# Patient Record
Sex: Male | Born: 1960 | Race: White | Hispanic: No | Marital: Married | Smoking: Never smoker
Health system: Southern US, Community
[De-identification: ages and names within clinical notes are randomized; demographics above are authoritative.]

## PROBLEM LIST (undated history)

## (undated) DIAGNOSIS — I10 Essential (primary) hypertension: Secondary | ICD-10-CM

## (undated) DIAGNOSIS — T304 Corrosion of unspecified body region, unspecified degree: Secondary | ICD-10-CM

## (undated) HISTORY — PX: SKIN GRAFT: SHX250

---

## 2005-09-30 ENCOUNTER — Ambulatory Visit: Payer: Self-pay

## 2006-06-09 ENCOUNTER — Emergency Department: Payer: Self-pay | Admitting: Emergency Medicine

## 2006-06-15 ENCOUNTER — Ambulatory Visit: Payer: Self-pay | Admitting: Family Medicine

## 2006-10-09 ENCOUNTER — Ambulatory Visit: Payer: Self-pay | Admitting: Family Medicine

## 2006-11-25 ENCOUNTER — Ambulatory Visit: Payer: Self-pay | Admitting: Family Medicine

## 2006-12-21 ENCOUNTER — Ambulatory Visit: Payer: Self-pay | Admitting: Family Medicine

## 2006-12-25 ENCOUNTER — Ambulatory Visit: Payer: Self-pay

## 2006-12-26 ENCOUNTER — Ambulatory Visit: Payer: Self-pay | Admitting: Family Medicine

## 2007-08-02 ENCOUNTER — Encounter (INDEPENDENT_AMBULATORY_CARE_PROVIDER_SITE_OTHER): Payer: Self-pay | Admitting: *Deleted

## 2007-12-06 ENCOUNTER — Telehealth: Payer: Self-pay | Admitting: Family Medicine

## 2007-12-18 ENCOUNTER — Encounter: Payer: Self-pay | Admitting: Family Medicine

## 2007-12-18 DIAGNOSIS — I10 Essential (primary) hypertension: Secondary | ICD-10-CM | POA: Insufficient documentation

## 2007-12-18 DIAGNOSIS — I739 Peripheral vascular disease, unspecified: Secondary | ICD-10-CM

## 2007-12-18 DIAGNOSIS — G4730 Sleep apnea, unspecified: Secondary | ICD-10-CM | POA: Insufficient documentation

## 2007-12-18 DIAGNOSIS — E785 Hyperlipidemia, unspecified: Secondary | ICD-10-CM | POA: Insufficient documentation

## 2007-12-18 DIAGNOSIS — K219 Gastro-esophageal reflux disease without esophagitis: Secondary | ICD-10-CM | POA: Insufficient documentation

## 2007-12-18 DIAGNOSIS — E669 Obesity, unspecified: Secondary | ICD-10-CM
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
## 2008-02-03 ENCOUNTER — Telehealth: Payer: Self-pay | Admitting: Family Medicine

## 2009-07-12 ENCOUNTER — Ambulatory Visit: Payer: Self-pay | Admitting: Gastroenterology

## 2009-09-10 ENCOUNTER — Ambulatory Visit: Payer: Self-pay | Admitting: Gastroenterology

## 2009-09-14 ENCOUNTER — Ambulatory Visit: Payer: Self-pay | Admitting: Gastroenterology

## 2009-10-11 ENCOUNTER — Ambulatory Visit: Payer: Self-pay | Admitting: Gastroenterology

## 2011-03-06 ENCOUNTER — Ambulatory Visit: Payer: Self-pay | Admitting: Cardiovascular Disease

## 2011-05-10 ENCOUNTER — Ambulatory Visit: Payer: Self-pay | Admitting: Family Medicine

## 2011-08-25 ENCOUNTER — Emergency Department: Payer: Self-pay | Admitting: Unknown Physician Specialty

## 2021-01-10 ENCOUNTER — Observation Stay: Payer: Self-pay

## 2021-01-10 ENCOUNTER — Inpatient Hospital Stay
Admission: EM | Admit: 2021-01-10 | Discharge: 2021-01-12 | DRG: 065 | Disposition: A | Discharge status: Home Health | Payer: Self-pay | Attending: Family Medicine | Admitting: Family Medicine

## 2021-01-10 ENCOUNTER — Emergency Department: Payer: Self-pay

## 2021-01-10 ENCOUNTER — Other Ambulatory Visit: Payer: Self-pay

## 2021-01-10 DIAGNOSIS — R4701 Aphasia: Secondary | ICD-10-CM | POA: Diagnosis present

## 2021-01-10 DIAGNOSIS — Z9114 Patient's other noncompliance with medication regimen: Secondary | ICD-10-CM

## 2021-01-10 DIAGNOSIS — R2990 Unspecified symptoms and signs involving the nervous system: Secondary | ICD-10-CM | POA: Insufficient documentation

## 2021-01-10 DIAGNOSIS — I6340 Cerebral infarction due to embolism of unspecified cerebral artery: Principal | ICD-10-CM | POA: Diagnosis present

## 2021-01-10 DIAGNOSIS — I639 Cerebral infarction, unspecified: Secondary | ICD-10-CM | POA: Diagnosis present

## 2021-01-10 DIAGNOSIS — G4730 Sleep apnea, unspecified: Secondary | ICD-10-CM | POA: Diagnosis present

## 2021-01-10 DIAGNOSIS — E876 Hypokalemia: Secondary | ICD-10-CM | POA: Diagnosis present

## 2021-01-10 DIAGNOSIS — Z8249 Family history of ischemic heart disease and other diseases of the circulatory system: Secondary | ICD-10-CM

## 2021-01-10 DIAGNOSIS — I6930 Unspecified sequelae of cerebral infarction: Secondary | ICD-10-CM

## 2021-01-10 DIAGNOSIS — R29701 NIHSS score 1: Secondary | ICD-10-CM | POA: Diagnosis present

## 2021-01-10 DIAGNOSIS — I672 Cerebral atherosclerosis: Secondary | ICD-10-CM | POA: Diagnosis present

## 2021-01-10 DIAGNOSIS — E785 Hyperlipidemia, unspecified: Secondary | ICD-10-CM | POA: Diagnosis present

## 2021-01-10 DIAGNOSIS — Z8673 Personal history of transient ischemic attack (TIA), and cerebral infarction without residual deficits: Secondary | ICD-10-CM

## 2021-01-10 DIAGNOSIS — I4891 Unspecified atrial fibrillation: Secondary | ICD-10-CM | POA: Diagnosis present

## 2021-01-10 DIAGNOSIS — Z825 Family history of asthma and other chronic lower respiratory diseases: Secondary | ICD-10-CM

## 2021-01-10 DIAGNOSIS — I6389 Other cerebral infarction: Secondary | ICD-10-CM

## 2021-01-10 DIAGNOSIS — Z20822 Contact with and (suspected) exposure to covid-19: Secondary | ICD-10-CM | POA: Diagnosis present

## 2021-01-10 DIAGNOSIS — I10 Essential (primary) hypertension: Secondary | ICD-10-CM | POA: Diagnosis present

## 2021-01-10 DIAGNOSIS — G4733 Obstructive sleep apnea (adult) (pediatric): Secondary | ICD-10-CM | POA: Diagnosis present

## 2021-01-10 DIAGNOSIS — I739 Peripheral vascular disease, unspecified: Secondary | ICD-10-CM | POA: Diagnosis present

## 2021-01-10 DIAGNOSIS — K219 Gastro-esophageal reflux disease without esophagitis: Secondary | ICD-10-CM | POA: Diagnosis present

## 2021-01-10 DIAGNOSIS — G8191 Hemiplegia, unspecified affecting right dominant side: Secondary | ICD-10-CM | POA: Diagnosis present

## 2021-01-10 HISTORY — DX: Corrosion of unspecified body region, unspecified degree: T30.4

## 2021-01-10 HISTORY — DX: Essential (primary) hypertension: I10

## 2021-01-10 LAB — COMPREHENSIVE METABOLIC PANEL
ALT: 18 U/L (ref 0–44)
AST: 19 U/L (ref 15–41)
Albumin: 4.3 g/dL (ref 3.5–5.0)
Alkaline Phosphatase: 57 U/L (ref 38–126)
Anion gap: 9 (ref 5–15)
BUN: 11 mg/dL (ref 6–20)
CO2: 26 mmol/L (ref 22–32)
Calcium: 9.1 mg/dL (ref 8.9–10.3)
Chloride: 106 mmol/L (ref 98–111)
Creatinine, Ser: 0.98 mg/dL (ref 0.61–1.24)
GFR, Estimated: >60 mL/min (ref >60)
Glucose, Bld: 101 mg/dL — ABNORMAL HIGH (ref 70–99)
Potassium: 3.5 mmol/L (ref 3.5–5.1)
Sodium: 141 mmol/L (ref 135–145)
Total Bilirubin: 0.6 mg/dL (ref 0.3–1.2)
Total Protein: 7.3 g/dL (ref 6.5–8.1)

## 2021-01-10 LAB — URINALYSIS, COMPLETE (UACMP) WITH MICROSCOPIC
Bacteria, UA: NONE SEEN
Bilirubin Urine: NEGATIVE
Glucose, UA: NEGATIVE mg/dL
Hgb urine dipstick: NEGATIVE
Ketones, ur: 5 mg/dL — AB
Leukocytes,Ua: NEGATIVE
Nitrite: NEGATIVE
Protein, ur: NEGATIVE mg/dL
Specific Gravity, Urine: 1.02 (ref 1.005–1.030)
Squamous Epithelial / HPF: NONE SEEN (ref 0–5)
pH: 6 (ref 5.0–8.0)

## 2021-01-10 LAB — URINE DRUG SCREEN, QUALITATIVE (ARMC ONLY)
Amphetamines, Ur Screen: NOT DETECTED
Barbiturates, Ur Screen: NOT DETECTED
Benzodiazepine, Ur Scrn: NOT DETECTED
Cannabinoid 50 Ng, Ur ~~LOC~~: NOT DETECTED
Cocaine Metabolite,Ur ~~LOC~~: NOT DETECTED
MDMA (Ecstasy)Ur Screen: NOT DETECTED
Methadone Scn, Ur: NOT DETECTED
Opiate, Ur Screen: NOT DETECTED
Phencyclidine (PCP) Ur S: NOT DETECTED
Tricyclic, Ur Screen: NOT DETECTED

## 2021-01-10 LAB — CBC
HCT: 48.1 % (ref 39.0–52.0)
Hemoglobin: 15.9 g/dL (ref 13.0–17.0)
MCH: 29.3 pg (ref 26.0–34.0)
MCHC: 33.1 g/dL (ref 30.0–36.0)
MCV: 88.6 fL (ref 80.0–100.0)
Platelets: 297 10*3/uL (ref 150–400)
RBC: 5.43 MIL/uL (ref 4.22–5.81)
RDW: 13.4 % (ref 11.5–15.5)
WBC: 4.2 10*3/uL (ref 4.0–10.5)
nRBC: 0 % (ref 0.0–0.2)

## 2021-01-10 LAB — TROPONIN I (HIGH SENSITIVITY): Troponin I (High Sensitivity): 6 ng/L (ref <18)

## 2021-01-10 LAB — ETHANOL: Alcohol, Ethyl (B): <10 mg/dL (ref <10)

## 2021-01-10 LAB — RESP PANEL BY RT-PCR (FLU A&B, COVID) ARPGX2
Influenza A by PCR: NEGATIVE
Influenza B by PCR: NEGATIVE
SARS Coronavirus 2 by RT PCR: NEGATIVE

## 2021-01-10 MED ORDER — ACETAMINOPHEN 160 MG/5ML PO SOLN
650.0000 mg | ORAL | Status: DC | PRN
  Filled 2021-01-10: qty 20.3

## 2021-01-10 MED ORDER — LISINOPRIL 10 MG PO TABS
10.0000 mg | ORAL_TABLET | Freq: Every day | ORAL | Status: DC
  Administered 2021-01-11 – 2021-01-12 (×2): 10 mg via ORAL
  Filled 2021-01-10 (×2): qty 1

## 2021-01-10 MED ORDER — HYDRALAZINE HCL 20 MG/ML IJ SOLN
10.0000 mg | Freq: Four times a day (QID) | INTRAMUSCULAR | Status: DC | PRN
Start: 2021-01-10 — End: 2021-01-12

## 2021-01-10 MED ORDER — ONDANSETRON HCL 4 MG/2ML IJ SOLN: 4.0000 mg | Freq: Four times a day (QID) | INTRAMUSCULAR | Status: DC | PRN

## 2021-01-10 MED ORDER — ATORVASTATIN CALCIUM 20 MG PO TABS: 40.0000 mg | ORAL_TABLET | Freq: Every day | ORAL | Status: DC

## 2021-01-10 MED ORDER — ASPIRIN EC 81 MG PO TBEC
81.0000 mg | DELAYED_RELEASE_TABLET | Freq: Every day | ORAL | Status: DC
  Administered 2021-01-11 – 2021-01-12 (×2): 81 mg via ORAL
  Filled 2021-01-10 (×2): qty 1

## 2021-01-10 MED ORDER — IOHEXOL 350 MG/ML SOLN
75.0000 mL | Freq: Once | INTRAVENOUS | Status: AC | PRN
  Administered 2021-01-10: 75 mL via INTRAVENOUS

## 2021-01-10 MED ORDER — ACETAMINOPHEN 650 MG RE SUPP: 650.0000 mg | RECTAL | Status: DC | PRN

## 2021-01-10 MED ORDER — CLOPIDOGREL BISULFATE 75 MG PO TABS
75.0000 mg | ORAL_TABLET | Freq: Every day | ORAL | Status: DC
  Administered 2021-01-10 – 2021-01-12 (×3): 75 mg via ORAL
  Filled 2021-01-10 (×3): qty 1

## 2021-01-10 MED ORDER — ASPIRIN 81 MG PO CHEW
324.0000 mg | CHEWABLE_TABLET | Freq: Once | ORAL | Status: AC
  Administered 2021-01-10: 324 mg via ORAL
  Filled 2021-01-10: qty 4

## 2021-01-10 MED ORDER — STROKE: EARLY STAGES OF RECOVERY BOOK: Freq: Once | Status: DC

## 2021-01-10 MED ORDER — ENOXAPARIN SODIUM 40 MG/0.4ML ~~LOC~~ SOLN
40.0000 mg | SUBCUTANEOUS | Status: DC
  Administered 2021-01-10 – 2021-01-11 (×2): 40 mg via SUBCUTANEOUS
  Filled 2021-01-10 (×2): qty 0.4

## 2021-01-10 MED ORDER — ACETAMINOPHEN 325 MG PO TABS: 650.0000 mg | ORAL_TABLET | ORAL | Status: DC | PRN

## 2021-01-10 NOTE — ED Notes (Signed)
Pt ambulatory to restroom without assistance at this time.  

## 2021-01-10 NOTE — ED Triage Notes (Addendum)
Pt c/o feeling confused for the past week or 2, denies any pain. Clear speech, denies any numbness, steady gait to triage, no facial droop noted. Pt has some pauses in speech , having difficulty finding the right words
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
## 2021-01-10 NOTE — ED Notes (Signed)
Pt transported to CT at this time.

## 2021-01-10 NOTE — Evaluation (Signed)
Physical Therapy Evaluation Patient Details Name: Dalton Wang MRN: [id number] DOB: [birth year] Today's Date: 01/10/2021   History of Present Illness  Pt is a 60 y.o. male with a past medical history of hypertension, hyperlipidemia, gastric reflux, presents to the emergency department for confusion.  MD assessment includes AMS and acute CVA.  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    Clinical Impression  Pt was pleasant and motivated to participate during the session.  Pt presented with some word finding/formulating difficulty as well as difficulty with details during history taking that he stated was not baseline for him but was able to follow all commands well during the session.  Pt presented with no noted deficits in strength, sensation, or vision but did present with deficits in BUE fine motor coordination and was mildly unsteady in standing balance training and during gait training.  Pt declined use of the RW during the session but education provided on safety benefits of the walker at this time.  Pt will benefit from HHPT services upon discharge to safely address deficits listed in patient problem list for decreased caregiver assistance and eventual return to PLOF.      Follow Up Recommendations Home health PT;Supervision for mobility/OOB    Equipment Recommendations  None recommended by PT    Recommendations for Other Services       Precautions / Restrictions Precautions Precautions: Fall Restrictions Weight Bearing Restrictions: No      Mobility  Bed Mobility Overal bed mobility: Independent                  Transfers Overall transfer level: Needs assistance Equipment used: None Transfers: Sit to/from Stand Sit to Stand: Supervision         General transfer comment: Min sway upon coming to initial stand  but able to self-correct; good eccentric and concentric control during sit to/from stand  Ambulation/Gait Ambulation/Gait assistance: Min guard Gait Distance (Feet): 200 Feet Assistive device: None Gait Pattern/deviations: Step-through pattern;Decreased step length - right;Decreased step length - left;Drifts right/left;Narrow base of support;Scissoring Gait velocity: decreased   General Gait Details: Slow cadence with mild drifting left/right and occasional mild scissoring but did not require physical assistance to prevent LOB; increased sway and scissoring during dynamic gait training with head turns  Stairs            Wheelchair Mobility    Modified Rankin (Stroke Patients Only)       Balance Overall balance assessment: Needs assistance   Sitting balance-Leahy Scale: Normal     Standing balance support: No upper extremity supported;During functional activity Standing balance-Leahy Scale: Fair Standing balance comment: Per above drifting left/right during ambulation; also pt unable to maintain balance during LLE SLS attempt and required assist to prevent fall; steady with static standing with feet together and eyes closed Single Leg Stance - Right Leg: 5 Single Leg Stance - Left Leg: 2                         Pertinent Vitals/Pain Pain Assessment: No/denies pain    Home Living Family/patient expects to be discharged to:: Private residence Living Arrangements: Alone Available Help at Discharge: Family;Available 24 hours/day Type of Home: House Home Access: Stairs to enter Entrance Stairs-Rails: Right Entrance Stairs-Number of Steps: 2 Home Layout: One level Home Equipment: Other (comment) Additional Comments:  Pt stated has multiple walkers that belonged to his parents but unsure of the type    Prior Function Level of Independence: Independent         Comments: Ind amb community distances, no fall history, Ind with ADLs     Hand Dominance         Extremity/Trunk Assessment   Upper Extremity Assessment Upper Extremity Assessment: LUE deficits/detail;RUE deficits/detail RUE Deficits / Details: RUE elbow and grip strength 5/5, decreased fine motor coordination bilaterally but R>L RUE Sensation: WNL RUE Coordination: decreased fine motor LUE Deficits / Details: LUE elbow and grip strength 5/5; decreased fine motor coordination bilaterally but R>L LUE Sensation: WNL LUE Coordination: decreased fine motor    Lower Extremity Assessment Lower Extremity Assessment: Overall WFL for tasks assessed       Communication   Communication: Expressive difficulties (Pt with some difficulty with word finding/formulation)  Cognition Arousal/Alertness: Awake/alert Behavior During Therapy: Flat affect Overall Cognitive Status: No family/caregiver present to determine baseline cognitive functioning                                 General Comments: Pt with minor difficulty at times providing details during history taking but was able to answer questions with increased time and followed all commands well      General Comments      Exercises Other Exercises Other Exercises: Dynamic gait training with start/stops, head turns, and 180 deg pivot turns Other Exercises: Static standing balance training with combinations of foot positions, head turns, and eyes open/closed   Assessment/Plan    PT Assessment Patient needs continued PT services  PT Problem List Decreased balance;Decreased coordination;Decreased knowledge of use of DME       PT Treatment Interventions DME instruction;Gait training;Stair training;Functional mobility training;Therapeutic activities;Therapeutic exercise;Balance training;Patient/family education    PT Goals (Current goals can be found in the Care Plan section)  Acute Rehab PT Goals Patient Stated Goal: To walk better and return home PT Goal Formulation: With patient Time For Goal Achievement:  01/23/21 Potential to Achieve Goals: Good    Frequency 7X/week   Barriers to discharge        Co-evaluation               AM-PAC PT "6 Clicks" Mobility  Outcome Measure Help needed turning from your back to your side while in a flat bed without using bedrails?: None Help needed moving from lying on your back to sitting on the side of a flat bed without using bedrails?: None Help needed moving to and from a bed to a chair (including a wheelchair)?: A Little Help needed standing up from a chair using your arms (e.g., wheelchair or bedside chair)?: A Little Help needed to walk in hospital room?: A Little Help needed climbing 3-5 steps with a railing? : A Little 6 Click Score: 20    End of Session Equipment Utilized During Treatment: Gait belt Activity Tolerance: Patient tolerated treatment well Patient left: in bed;with nursing/sitter in room Nurse Communication: Mobility status PT Visit Diagnosis: Unsteadiness on feet (R26.81);Difficulty in walking, not elsewhere classified (R26.2);Other symptoms and signs involving the nervous system (R29.898)    Time: 3007-6226 PT Time Calculation (min) (ACUTE ONLY): 23 min   Charges:   PT Evaluation $PT Eval Moderate Complexity: 1 Mod PT Treatments $Therapeutic Exercise: 8-22 mins        D. Elly Modena PT, DPT 01/10/21, 5:39 PM
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
## 2021-01-10 NOTE — ED Notes (Signed)
Pt via POV from home. Pt c/o increased confusion the past couple of [place]. Pt has recently taken off of his home medications BP. Denies any pain, fevers, NVD. Pt is alert and oriented to self, situation, and time but disoriented to place. Denies any urinary symptoms.
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
## 2021-01-10 NOTE — Progress Notes (Signed)
   01/10/21 1525  Clinical Encounter Type  Visited With Patient  Visit Type Initial  Referral From Nurse  Consult/Referral To Chaplain  Spiritual Encounters  Spiritual Needs Prayer;Emotional  Chaplain Mattox Schorr responded to a Code Stemi in ED-2, Pt Dalton Wang. Pt stable but disoriented to surroundings. I provided ministry of presence and prayer. No family members present at the time.

## 2021-01-10 NOTE — ED Notes (Signed)
Pt given meal tray.

## 2021-01-11 ENCOUNTER — Observation Stay (HOSPITAL_COMMUNITY)
Admit: 2021-01-11 | Discharge: 2021-01-11 | Disposition: A | Discharge status: Home / Self Care | Payer: Self-pay | Attending: Internal Medicine | Admitting: Internal Medicine

## 2021-01-11 DIAGNOSIS — I6389 Other cerebral infarction: Secondary | ICD-10-CM

## 2021-01-11 DIAGNOSIS — I639 Cerebral infarction, unspecified: Secondary | ICD-10-CM

## 2021-01-11 LAB — BASIC METABOLIC PANEL
Anion gap: 7 (ref 5–15)
BUN: 11 mg/dL (ref 6–20)
CO2: 25 mmol/L (ref 22–32)
Calcium: 8.8 mg/dL — ABNORMAL LOW (ref 8.9–10.3)
Chloride: 107 mmol/L (ref 98–111)
Creatinine, Ser: 0.78 mg/dL (ref 0.61–1.24)
GFR, Estimated: >60 mL/min (ref >60)
Glucose, Bld: 90 mg/dL (ref 70–99)
Potassium: 3.4 mmol/L — ABNORMAL LOW (ref 3.5–5.1)
Sodium: 139 mmol/L (ref 135–145)

## 2021-01-11 LAB — LIPID PANEL
Cholesterol: 204 mg/dL — ABNORMAL HIGH (ref 0–200)
HDL: 41 mg/dL (ref >40)
LDL Cholesterol: 147 mg/dL — ABNORMAL HIGH (ref 0–99)
Total CHOL/HDL Ratio: 5 RATIO
Triglycerides: 79 mg/dL (ref <150)
VLDL: 16 mg/dL (ref 0–40)

## 2021-01-11 LAB — CBC
HCT: 43.8 % (ref 39.0–52.0)
Hemoglobin: 15.1 g/dL (ref 13.0–17.0)
MCH: 30.3 pg (ref 26.0–34.0)
MCHC: 34.5 g/dL (ref 30.0–36.0)
MCV: 87.8 fL (ref 80.0–100.0)
Platelets: 276 10*3/uL (ref 150–400)
RBC: 4.99 MIL/uL (ref 4.22–5.81)
RDW: 13.4 % (ref 11.5–15.5)
WBC: 4.8 10*3/uL (ref 4.0–10.5)
nRBC: 0 % (ref 0.0–0.2)

## 2021-01-11 LAB — ECHOCARDIOGRAM COMPLETE BUBBLE STUDY
AR max vel: 1.95 cm2
AV Area VTI: 2.43 cm2
AV Area mean vel: 1.89 cm2
AV Mean grad: 10 mmHg
AV Peak grad: 17.8 mmHg
Ao pk vel: 2.11 m/s
Area-P 1/2: 2.92 cm2
S' Lateral: 2.69 cm

## 2021-01-11 LAB — HEMOGLOBIN A1C
Hgb A1c MFr Bld: 5.2 % (ref 4.8–5.6)
Mean Plasma Glucose: 102.54 mg/dL

## 2021-01-11 LAB — TSH: TSH: 2.357 u[IU]/mL (ref 0.350–4.500)

## 2021-01-11 LAB — HIV ANTIBODY (ROUTINE TESTING W REFLEX): HIV Screen 4th Generation wRfx: NONREACTIVE

## 2021-01-11 MED ORDER — ATORVASTATIN CALCIUM 20 MG PO TABS
80.0000 mg | ORAL_TABLET | Freq: Every day | ORAL | Status: DC
  Administered 2021-01-11 – 2021-01-12 (×2): 80 mg via ORAL
  Filled 2021-01-11 (×2): qty 4

## 2021-01-11 MED ORDER — POTASSIUM CHLORIDE CRYS ER 20 MEQ PO TBCR
40.0000 meq | EXTENDED_RELEASE_TABLET | Freq: Once | ORAL | Status: AC
  Administered 2021-01-11: 11:00:00 40 meq via ORAL
  Filled 2021-01-11: qty 2

## 2021-01-11 NOTE — Progress Notes (Signed)
Progress Note    Dalton Wang  WUJ:[id number] DOB: 12/04/1960  DOA: 01/10/2021 PCP: Patient, No Pcp Per    Brief Narrative:    Medical records reviewed and are as summarized below:  Dalton Wang is an 60 y.o. male with medical history significant for hypertension, GERD, seasonal allergies, history of bilateral upper extremity surgeries secondary to traumatic fractures in the 1980s in 1993, history of forearm skin grafts, presented to the emergency department for chief concerns of confusion and weakness.  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 Mild irregularity on the left that could possibly be a source of embolic disease. - Complete echo with bubble study - MRI of the brain revealed acute infarction in the left basal ganglia and radiating white matter tracts measuring 2.7 cm in size.  Punctate acute infarction in the right external capsule and inferior basal ganglia on the right - LDL: 147- statin added --HgbA1c: 5.2 -tele monitoring -ASA/plavix -PT/OT  Hypokalemia -replete  Hypertension -previously on hydrochlorothiazide and ACE inhibitor -Stopped seeing his PCP around the time Covid started -Hydralazine 10 mg IV as needed every 6 hours for SBP greater than 160 -per neuro note goal is <160   Family Communication/Anticipated D/C date and plan/Code Status   DVT prophylaxis: Lovenox ordered. Code Status: Full Code.  Disposition Plan: Status is: Observation  The patient will require care spanning > 2 midnights and should be moved to inpatient because: Inpatient level of care appropriate due to severity of illness  Dispo: The patient is from: Home              Anticipated d/c is to: Home              Patient currently is not medically  stable to d/c.   Difficult to place patient yes -- no insurance listed if needs SNF (lives alone)         Medical Consultants:    neurology  Subjective:   Understands he had several strokes  Objective:    Vitals:   01/10/21 2230 01/10/21 2323 01/11/21 0103 01/11/21 0412  BP: (!) 157/96 (!) 148/100 (!) 173/100 123/83  Pulse: (!) 55 (!) 53 (!) 54 (!) 52  Resp: 16 15 15    Temp:   (!) 97.5 F (36.4 C) 97.6 F (36.4 C)  TempSrc:   Oral Oral  SpO2: 98% 98% 98% 98%  Weight:   87.3 kg   Height:   6\' 2"  (1.88 m)     Intake/Output Summary (Last 24 hours) at 01/11/2021 1125 Last data filed at 01/11/2021 1023 Gross per 24 hour  Intake 360 ml  Output -  Net 360 ml   Filed Weights   01/10/21 1015 01/11/21 0103  Weight: 90.7 kg 87.3 kg    Exam:  General: Appearance:    Well developed, well nourished male in no acute distress     Lungs:     respirations unlabored  Heart:    Bradycardic. Normal rhythm. No murmurs, rubs, or gallops.   MS:   All extremities are intact.   Neurologic:   Awake, alert, oriented x 3    Data Reviewed:   I have personally reviewed following labs and imaging studies:  Labs: Labs show the following:  Basic Metabolic Panel: Recent Labs  Lab 01/10/21 1021 01/11/21 0452  NA 141 139  K 3.5 3.4*  CL 106 107  CO2 26 25  GLUCOSE 101* 90  BUN 11 11  CREATININE 0.98 0.78  CALCIUM 9.1 8.8*   GFR Estimated Creatinine Clearance: 114.2 mL/min (by C-G formula based on SCr of 0.78 mg/dL). Liver Function Tests: Recent Labs  Lab 01/10/21 1021  AST 19  ALT 18  ALKPHOS 57  BILITOT 0.6  PROT 7.3  ALBUMIN 4.3   No results for input(s): LIPASE, AMYLASE in the last 168 hours. No results for input(s): AMMONIA in the last 168 hours. Coagulation profile No results for input(s): INR, PROTIME in the last 168 hours.  CBC: Recent Labs  Lab 01/10/21 1021 01/11/21 0452  WBC 4.2 4.8  HGB 15.9 15.1  HCT 48.1 43.8  MCV 88.6 87.8  PLT 297 276    Cardiac Enzymes: No results for input(s): CKTOTAL, CKMB, CKMBINDEX, TROPONINI in the last 168 hours. BNP (last 3 results) No results for input(s): PROBNP in the last 8760 hours. CBG: No results for input(s): GLUCAP in the last 168 hours. D-Dimer: No results for input(s): DDIMER in the last 72 hours. Hgb A1c: Recent Labs    01/11/21 0452  HGBA1C 5.2   Lipid Profile: Recent Labs    01/11/21 0452  CHOL 204*  HDL 41  LDLCALC 147*  TRIG 79  CHOLHDL 5.0   Thyroid function studies: Recent Labs    01/11/21 0452  TSH 2.357   Anemia work up: No results for input(s): VITAMINB12, FOLATE, FERRITIN, TIBC, IRON, RETICCTPCT in the last 72 hours. Sepsis Labs: Recent Labs  Lab 01/10/21 1021 01/11/21 0452  WBC 4.2 4.8    Microbiology Recent Results (from the past 240 hour(s))  Resp Panel by RT-PCR (Flu A&B, Covid) Nasopharyngeal Swab     Status: None   Collection Time: 01/10/21 11:18 AM   Specimen: Nasopharyngeal Swab; Nasopharyngeal(NP) swabs in vial transport medium  Result Value Ref Range Status   SARS Coronavirus 2 by RT PCR NEGATIVE NEGATIVE Final    Comment: (NOTE) SARS-CoV-2 target nucleic acids are NOT DETECTED.  The SARS-CoV-2 RNA is generally detectable in upper respiratory specimens during the acute phase of infection. The lowest concentration of SARS-CoV-2 viral copies this assay can detect is 138 copies/mL. A negative result does not preclude SARS-Cov-2 infection and should not be used as the sole basis for treatment or other patient management decisions. A negative result may occur with  improper specimen collection/handling, submission of specimen other than nasopharyngeal swab, presence of viral mutation(s) within the areas targeted by this assay, and inadequate number of viral copies(<138 copies/mL). A negative result must be combined with clinical observations, patient history, and epidemiological information. The expected result is Negative.  Fact  Sheet for Patients:  BloggerCourse.com  Fact Sheet for Healthcare Providers:  SeriousBroker.it  This test is no t yet approved or cleared by the [place] FDA and  has been authorized for detection and/or diagnosis of SARS-CoV-2 by FDA under an Emergency Use Authorization (EUA). This EUA will remain  in effect (meaning this test can be used) for the duration of the COVID-19 declaration under Section 564(b)(1) of the Act, 21 U.S.C.section 360bbb-3(b)(1), unless the authorization is terminated  or revoked sooner.       Influenza A by PCR NEGATIVE NEGATIVE Final   Influenza B by PCR NEGATIVE NEGATIVE Final    Comment: (NOTE) The Xpert Xpress SARS-CoV-2/FLU/RSV plus assay is intended  as an aid in the diagnosis of influenza from Nasopharyngeal swab specimens and should not be used as a sole basis for treatment. Nasal washings and aspirates are unacceptable for Xpert Xpress SARS-CoV-2/FLU/RSV testing.  Fact Sheet for Patients: BloggerCourse.com  Fact Sheet for Healthcare Providers: SeriousBroker.it  This test is not yet approved or cleared by the [place] FDA and has been authorized for detection and/or diagnosis of SARS-CoV-2 by FDA under an Emergency Use Authorization (EUA). This EUA will remain in effect (meaning this test can be used) for the duration of the COVID-19 declaration under Section 564(b)(1) of the Act, 21 U.S.C. section 360bbb-3(b)(1), unless the authorization is terminated or revoked.  Performed at [facility], [address] [place], [place] [address]     Procedures and diagnostic studies:  CT Head Wo Contrast  Result Date: 01/10/2021 CLINICAL DATA:  Mental status change EXAM: CT HEAD WITHOUT CONTRAST TECHNIQUE: Contiguous axial images were obtained from the base of the skull through the vertex without intravenous contrast. COMPARISON:   08/25/2011 FINDINGS: Brain: No evidence of acute infarction, hemorrhage, hydrocephalus, extra-axial collection or mass lesion/mass effect. Extensive chronic small vessel ischemic disease is identified within bilateral cerebral hemisphere. Multiple lacunar infarcts are identified within the subcortical white matter of the right frontal lobe, right parietal lobe, and left posterior frontal lobe. Chronic appearing right basal ganglia and right thalamic lacunar infarcts are noted. In the left basal ganglia there is a asymmetric area of low attenuation measuring 2 cm compatible with subacute to chronic infarct, image 17/2. Vascular: No hyperdense vessel or unexpected calcification. Skull: None Sinuses/Orbits: No acute finding. Other: None. IMPRESSION: 1. No acute intracranial abnormalities. 2. Chronic small vessel ischemic disease and brain atrophy. 3. 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 Electronically Signed   By: Signa Kell M.D.   On: 01/10/2021 11:59   MR BRAIN WO CONTRAST  Result Date: 01/10/2021 CLINICAL DATA:  Mental status changes.  Confusion. EXAM: MRI HEAD WITHOUT CONTRAST TECHNIQUE: Multiplanar, multiecho pulse sequences of the brain and surrounding structures were obtained without intravenous contrast. COMPARISON:  Head CT same day FINDINGS: Brain: Diffusion imaging shows a punctate acute infarction in the external capsule and within the inferior basal ganglia on the right. On the left, there is a larger region of acute infarction, measuring up to 2.7 cm in size, affecting the basal ganglia and radiating white matter tracts. Elsewhere, there are old small vessel infarctions within both sides of the pons. Old insults within both middle cerebellar peduncles. Multiple old small vessel thalamic infarctions. Old small vessel basal ganglia infarctions. Moderate chronic small-vessel ischemic changes of the hemispheric white. No cortical or large vessel territory infarction. No mass lesion,  hemorrhage, hydrocephalus or extra-axial collection. Vascular: Major vessels at the base of the brain show flow. Skull and upper cervical spine: Negative Sinuses/Orbits: Clear/normal Other: None IMPRESSION: Background pattern of extensive chronic small vessel ischemic changes throughout the brain as outlined above. 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 Punctate acute infarction in the right external capsule and inferior basal ganglia on the right. Electronically Signed   By: Paulina Fusi M.D.   On: 01/10/2021 14:31   CT ANGIO HEAD CODE STROKE  Result Date: 01/10/2021 CLINICAL DATA:  Follow-up stroke shown earlier today by MRI. EXAM: CT ANGIOGRAPHY HEAD AND NECK TECHNIQUE: Multidetector CT imaging of the head and neck was performed using the standard protocol during bolus administration of intravenous contrast.  Multiplanar CT image reconstructions and MIPs were obtained to evaluate the vascular anatomy. Carotid stenosis measurements (when applicable) are obtained utilizing NASCET criteria, using the distal internal carotid diameter as the denominator. CONTRAST:  73mL OMNIPAQUE IOHEXOL 350 MG/ML SOLN COMPARISON:  MR and CT studies earlier today. FINDINGS: CTA NECK FINDINGS Aortic arch: Some aortic atherosclerosis. Branching pattern is normal without origin stenosis. Right carotid system: Common carotid artery widely patent to the bifurcation. Soft and calcified plaque at the carotid bifurcation but no stenosis. Cervical ICA widely patent. Left carotid system: Common carotid artery widely patent to the bifurcation. Soft and calcified plaque at the carotid bifurcation. Mild irregularity that could possibly be a source of embolic disease. No stenosis compared to the more distal cervical ICA diameter. Vertebral arteries: Both vertebral artery origins are patent without stenosis greater than 30%. Both vertebral arteries are patent through the  cervical region to the foramen magnum. Skeleton: Ordinary cervical spondylosis. Other neck: No mass or lymphadenopathy. Upper chest: Lung apices are clear. Review of the MIP images confirms the above findings CTA HEAD FINDINGS Anterior circulation: Both internal carotid arteries are patent through the skull base and siphon regions. Ordinary siphon atherosclerotic calcification but without stenosis greater than 30%. The anterior and middle cerebral vessels are patent without proximal stenosis, aneurysm or vascular malformation. Diminutive A1 segment, probably congenital. No visible large or medium vessel occlusion. More distal branch vessels show atherosclerotic irregularity. Posterior circulation: Both vertebral arteries are patent through the foramen magnum. There is atherosclerotic calcification in both vertebral artery V4 segments, with stenosis estimated at 50% on both sides. Both vessels do reach the basilar. No basilar stenosis. Posterior circulation branch vessels are patent. More distal branch vessels show atherosclerotic irregularity. Venous sinuses: Patent and normal. Anatomic variants: None significant. Review of the MIP images confirms the above findings IMPRESSION: 1. Atherosclerotic disease at both carotid bifurcations but without stenosis. Mild irregularity on the left that could possibly be a source of embolic disease. 2. Atherosclerotic calcification in both vertebral artery V4 segments, with stenosis estimated at 50% on both sides. 3. No intracranial large or medium vessel occlusion or correctable proximal stenosis. 4. Widespread intracranial distal vessel atherosclerotic irregularity. Aortic Atherosclerosis (ICD10-I70.0). Electronically Signed   By: Paulina Fusi M.D.   On: 01/10/2021 18:13   CT ANGIO NECK CODE STROKE  Result Date: 01/10/2021 CLINICAL DATA:  Follow-up stroke shown earlier today by MRI. EXAM: CT ANGIOGRAPHY HEAD AND NECK TECHNIQUE: Multidetector CT imaging of the head and neck  was performed using the standard protocol during bolus administration of intravenous contrast. Multiplanar CT image reconstructions and MIPs were obtained to evaluate the vascular anatomy. Carotid stenosis measurements (when applicable) are obtained utilizing NASCET criteria, using the distal internal carotid diameter as the denominator. CONTRAST:  30mL OMNIPAQUE IOHEXOL 350 MG/ML SOLN COMPARISON:  MR and CT studies earlier today. FINDINGS: CTA NECK FINDINGS Aortic arch: Some aortic atherosclerosis. Branching pattern is normal without origin stenosis. Right carotid system: Common carotid artery widely patent to the bifurcation. Soft and calcified plaque at the carotid bifurcation but no stenosis. Cervical ICA widely patent. Left carotid system: Common carotid artery widely patent to the bifurcation. Soft and calcified plaque at the carotid bifurcation. Mild irregularity that could possibly be a source of embolic disease. No stenosis compared to the more distal cervical ICA diameter. Vertebral arteries: Both vertebral artery origins are patent without stenosis greater than 30%. Both vertebral arteries are patent through the cervical region to the foramen magnum. Skeleton: Ordinary cervical spondylosis.  Other neck: No mass or lymphadenopathy. Upper chest: Lung apices are clear. Review of the MIP images confirms the above findings CTA HEAD FINDINGS Anterior circulation: Both internal carotid arteries are patent through the skull base and siphon regions. Ordinary siphon atherosclerotic calcification but without stenosis greater than 30%. The anterior and middle cerebral vessels are patent without proximal stenosis, aneurysm or vascular malformation. Diminutive A1 segment, probably congenital. No visible large or medium vessel occlusion. More distal branch vessels show atherosclerotic irregularity. Posterior circulation: Both vertebral arteries are patent through the foramen magnum. There is atherosclerotic calcification  in both vertebral artery V4 segments, with stenosis estimated at 50% on both sides. Both vessels do reach the basilar. No basilar stenosis. Posterior circulation branch vessels are patent. More distal branch vessels show atherosclerotic irregularity. Venous sinuses: Patent and normal. Anatomic variants: None significant. Review of the MIP images confirms the above findings IMPRESSION: 1. Atherosclerotic disease at both carotid bifurcations but without stenosis. Mild irregularity on the left that could possibly be a source of embolic disease. 2. Atherosclerotic calcification in both vertebral artery V4 segments, with stenosis estimated at 50% on both sides. 3. No intracranial large or medium vessel occlusion or correctable proximal stenosis. 4. Widespread intracranial distal vessel atherosclerotic irregularity. Aortic Atherosclerosis (ICD10-I70.0). Electronically Signed   By: Paulina Fusi M.D.   On: 01/10/2021 18:13    Medications:   .  stroke: mapping our early stages of recovery book   Does not apply Once  . aspirin EC  81 mg Oral Daily  . clopidogrel  75 mg Oral Daily  . enoxaparin (LOVENOX) injection  40 mg Subcutaneous Q24H  . lisinopril  10 mg Oral Daily  . potassium chloride  40 mEq Oral Once   Continuous Infusions:   LOS: 0 days   Joseph Art  [facility]   How to contact the [facility] Attending or Consulting provider 7A - 7P or covering provider during after hours 7P -7A, for this patient?  1. Check the care team in [facility] and look for a) attending/consulting TR[facility] provider listed and b) the [facility] team listed 2. Log into [email or web address] and use 's universal password to access. If you do not have the password, please contact the hospital operator. 3. Locate the [facility] provider you are looking for under [facility] and page to a number that you can be directly reached. 4. If you still have difficulty reaching the provider, please page the [facility] (Director on Call) for the  Hospitalists listed on amion for assistance.  01/11/2021, 11:25 AM

## 2021-01-11 NOTE — Progress Notes (Signed)
*  PRELIMINARY RESULTS* Echocardiogram 2D Echocardiogram has been performed.  Cristela Blue 01/11/2021, 9:52 AM

## 2021-01-11 NOTE — TOC Progression Note (Signed)
Transition of Care [facility]) - Progression Note    Patient Details  Name: Dalton Wang MRN: [id number] Date of Birth: May 09, 1961  [facility] CM/SW Contact  Su Hilt, RN Phone Number: 01/11/2021, 2:27 PM  Clinical Narrative:    Met with the patient and his son in the room to discuss DC plan and needs The patient no longer has a PCPHe does not have insurance  He was provided with an [facility] clinic application Referral made to [place] set up with [employer], spoke with Alroy Dust PT set [facility] with [facility] spoke with Helene Kelp His daughter and Son provide Transportation    Expected Discharge Plan: [place] Barriers to Discharge: Continued Medical Work up  Expected Discharge Plan and Services Expected Discharge Plan: [place]   Discharge Planning Services: [place]   Living arrangements for the past 2 months: Single Family Home                 DME Arranged: Gilford Rile rolling DME Agency: [employer] Date DME Agency Contacted: 01/11/21 Time DME Agency Contacted: [phone] Representative spoke with at DME Agency: Mardene Celeste [place]: PT [place]: [facility] [employer] Date [place]: 01/11/21 Time [place]: 67 Representative spoke with at [place]: [place] ([place]) Interventions    Readmission Risk Interventions No flowsheet data found.
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
## 2021-01-11 NOTE — Progress Notes (Signed)
SLP Cancellation Note  Patient Details Name: Dalton Wang MRN: [id number] DOB: [birth year]   Cancelled treatment:       Reason Eval/Treat Not Completed:  (chart reviewed; will f/u tomorrow w/ evaluation).      Jerilynn Som, MS, CCC-SLP Speech Language Pathologist Rehab Services [phone] [facility] 01/11/2021, 4:42 PM

## 2021-01-11 NOTE — Progress Notes (Signed)
Pt stated he does not wear a CPAP and does not wish to wear one while hear.

## 2021-01-11 NOTE — Progress Notes (Signed)
Neurology Progress Note Dalton Wang MR# [id number] 01/11/2021  S: No overnight events; no new complaints.   O: Current vital signs: BP 123/83 (BP Location: Left Arm)   Pulse (!) 52   Temp 97.6 F (36.4 C) (Oral)   Resp 15   Ht 6\' 2"  (1.88 m)   Wt 87.3 kg   SpO2 98%   BMI 24.70 kg/m  Vital signs in last 24 hours: Temp:  [97.5 F (36.4 C)-97.9 F (36.6 C)] 97.6 F (36.4 C) (03/15 0412) Pulse Rate:  [52-64] 52 (03/15 0412) Resp:  [15-20] 15 (03/15 0103) BP: (123-173)/(83-109) 123/83 (03/15 0412) SpO2:  [97 %-100 %] 98 % (03/15 0412) Weight:  [87.3 kg-90.7 kg] 87.3 kg (03/15 0103) GENERAL: NAD Mental Status: Patient is awake, alert, oriented to person, place, month, year, and situation. Continues to have mild impaired fluency, with intact naming, comprehension and repetition. Visual Fields are full. Pupils are equal, round, and reactive to light. EOMI without ptosis or diploplia.  Facial sensation is symmetric to temperature Facial movement is symmetric.  Hearing is intact to voice. Uvula midline and palate elevates symmetrically. Shoulder shrug is symmetric. Tongue is midline without atrophy or fasciculations.  Tone is normal. Bulk is normal. 5/5 strength was present in all four extremities. Sensation is symmetric to light touch and temperature in the arms and legs. Deep Tendon Reflexes: 2+ and symmetric in the biceps and patellae. Toes are downgoing bilaterally. FNF and HKS are intact bilaterally. Gait - Deferred  NIHSS1  Medications  Current Facility-Administered Medications:  .   stroke: mapping our early stages of recovery book, , Does not apply, Once, Cox, Amy N, DO .  acetaminophen (TYLENOL) tablet 650 mg, 650 mg, Oral, Q4H PRN **OR** acetaminophen (TYLENOL) 160 MG/5ML solution 650 mg, 650 mg, Per Tube, Q4H PRN **OR** acetaminophen (TYLENOL) suppository 650 mg, 650 mg, Rectal, Q4H PRN, Cox, Amy N, DO .  aspirin EC tablet 81 mg, 81 mg, Oral, Daily, Cox, Amy N,  DO .  clopidogrel (PLAVIX) tablet 75 mg, 75 mg, Oral, Daily, 05-13-1994, NP, 75 mg at 01/10/21 1916 .  enoxaparin (LOVENOX) injection 40 mg, 40 mg, Subcutaneous, Q24H, Cox, Amy N, DO, 40 mg at 01/10/21 2140 .  hydrALAZINE (APRESOLINE) injection 10 mg, 10 mg, Intravenous, Q6H PRN, Cox, Amy N, DO .  lisinopril (ZESTRIL) tablet 10 mg, 10 mg, Oral, Daily, Cox, Amy N, DO .  ondansetron (ZOFRAN) injection 4 mg, 4 mg, Intravenous, Q6H PRN, Cox, Amy N, DO .  potassium chloride SA (KLOR-CON) CR tablet 40 mEq, 40 mEq, Oral, Once, Vann, Jessica U, DO Labs No results found for: HGBA1C    Ref. Range 01/11/2021 04:52  Potassium Latest Ref Range: 3.5 - 5.1 mmol/L 3.4 (L)    Ref. Range 01/11/2021 04:52  LDL (calc) Latest Ref Range: 0 - 99 mg/dL 01/13/2021 (H)    Ref. Range 01/11/2021 04:52  Hemoglobin A1C Latest Ref Range: 4.8 - 5.6 % 5.2    Imaging I have reviewed images in epic and the results pertinent to this consultation are:  CTA head and neck showed no large vessel occlusion, however bilateral carotid bifurcation atherosclerotic disease without flow limiting stenosis. Mild irregularity on the left that could possibly be a source of embolic disease. Bilateral V4 segment atherosclerotic stenotic disease ~50%. No intracranial large or medium vessel occlusion or correctable proximal stenosis. Widespread intracranial distal vessel atherosclerotic irregularity - Examples:    MRI brain showed acute strokes in the left basal ganglia and radiating  white matter tracts, and acute punctate infarction in the right external capsule and inferior basal ganglia. Multiple chronic strokes in the pons, middle cerebellar peduncles bilateral thalami and bilateral basal ganglia.   Assessment: Dalton Wang is a 60 y.o. male left handed PMHx OSA, PAD, HLD, HTN, non-adherence, chronic multifocal ischemic strokes without residual deficits now with acute to early subacute multifocal ischemic strokes and mildly impaired fluency.  Angiography showed atherosclerotic irregularities mostly distal branches>medium sized vessels which may be an embolic source. He will have to continue antiplatelet therapy indefinitely unless there is an indication to switch to another agent.  Impression:  Acute multifocal embolic strokes. Mildly impaired fluency. Chronic ischemic strokes - Multiple territories. Bilateral carotid artery disease  HTN - Uncontrolled HLD PAD Medication non-adherence.  Plan: -TTE - Result pending. -Continue start for LDL > 70. - Continue aspirin 81mg  daily indefinitely unless indication to switch to other agent. - Clopidogrel 75mg  daily for 3 [place] then stop. - BP goal <140/90 in nondiabetic and <130/90 in diabetic patients. - Telemetry monitoring for arrhythmia. - Continue working with PT/OT as directed. - Advised patient to follow up with PCP and to discuss his diagnosis of OSA to determine if CPAP is necessary as it will improve his HTN. - Strongly suggested he be compliant with medications.  - Please call Neurology for questions or if the echocardiogram shows PFO, left ventricular thrombus or wall motion abnormalities.  Electronically signed by:  , MD Page: 01/11/2021, 8:14 AM

## 2021-01-11 NOTE — Progress Notes (Signed)
Physical Therapy Treatment Patient Details Name: Dalton Wang MRN: [id number] DOB: 07-18-1961 Today's Date: 01/11/2021    History of Present Illness Pt is a 60 y.o. male with a past medical history of hypertension, hyperlipidemia, gastric reflux, presents to the emergency department for confusion.  MD assessment includes AMS and acute CVA.  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    PT Comments    Pt was pleasant and motivated to participate during the session.  Pt continued to present with min deficits in stability during balance training, most notably during LLE SLS, as well as during gait training.  Pt drifted mildly left/right during gait without an AD and required min A for stability on one occasion while doing a 180 deg turn to chair.  Pt advised to use his RW at home with supervision during mobility until cleared by HHPT.  Pt will benefit from HHPT services upon discharge to safely address deficits listed in patient problem list for decreased caregiver assistance and eventual return to PLOF.     Follow Up Recommendations  Home health PT;Supervision for mobility/OOB     Equipment Recommendations  None recommended by PT    Recommendations for Other Services       Precautions / Restrictions Precautions Precautions: Fall Restrictions Weight Bearing Restrictions: No    Mobility  Bed Mobility Overal bed mobility: Independent                  Transfers Overall transfer level: Needs assistance Equipment used: None Transfers: Sit to/from Stand Sit to Stand: Supervision         General transfer comment: Min sway upon coming to initial stand but able to self-correct; good eccentric and concentric control during sit to/from stand  Ambulation/Gait Ambulation/Gait assistance: Min assist Gait Distance (Feet): 400  Feet Assistive device: None Gait Pattern/deviations: Step-through pattern;Decreased step length - right;Decreased step length - left;Drifts right/left;Narrow base of support Gait velocity: decreased   General Gait Details: Pt with occasional mild drifting left/right and required min A on one occasion during a sharp turn to chair   Stairs             Wheelchair Mobility    Modified Rankin (Stroke Patients Only)       Balance Overall balance assessment: Needs assistance   Sitting balance-Leahy Scale: Normal     Standing balance support: No upper extremity supported;During functional activity Standing balance-Leahy Scale: Poor Standing balance comment: Min A for stability during 180 deg turn this session Single Leg Stance - Right Leg: 15 Single Leg Stance - Left Leg: 4           High Level Balance Comments: Pt able to stand with good stability on RLE x 15 sec but frequently lost balance while trying to stand on the LLE requiring min-mod A to prevent fall            Cognition Arousal/Alertness: Awake/alert Behavior During Therapy: Flat affect Overall Cognitive Status: No family/caregiver present to determine baseline cognitive functioning                                 General Comments: Occasional need of extra time/cuing to follow commands correctly      Exercises Total Joint Exercises Ankle Circles/Pumps: Strengthening;Both;10 reps Quad Sets: Strengthening;Both;10 reps Gluteal  Sets: 10 reps;Both;Strengthening Heel Slides: Strengthening;Both;10 reps Hip ABduction/ADduction: Strengthening;Both;10 reps Straight Leg Raises: 10 reps;Both;Strengthening Long Arc Quad: Strengthening;Both;10 reps Knee Flexion: 10 reps;Both;Strengthening Marching in Standing: AROM;Both;10 reps;Standing Other Exercises Other Exercises: HEP education for BLE APs, QS, GS, LAQ, SLR, and hip abd/add x 10 each 2-3x/day to prevent deconditioning in acute care Other  Exercises: Static standing balance training with combinations of foot positions, head turns, and eyes open/closed    General Comments        Pertinent Vitals/Pain Pain Assessment: No/denies pain    Home Living                      Prior Function            PT Goals (current goals can now be found in the care plan section) Progress towards PT goals: Progressing toward goals    Frequency    7X/week      PT Plan Current plan remains appropriate    Co-evaluation              AM-PAC PT "6 Clicks" Mobility   Outcome Measure  Help needed turning from your back to your side while in a flat bed without using bedrails?: None Help needed moving from lying on your back to sitting on the side of a flat bed without using bedrails?: None Help needed moving to and from a bed to a chair (including a wheelchair)?: A Little Help needed standing up from a chair using your arms (e.g., wheelchair or bedside chair)?: A Little Help needed to walk in hospital room?: A Little Help needed climbing 3-5 steps with a railing? : A Little 6 Click Score: 20    End of Session Equipment Utilized During Treatment: Gait belt Activity Tolerance: Patient tolerated treatment well Patient left: in chair;with call bell/phone within reach;with chair alarm set Nurse Communication: Mobility status PT Visit Diagnosis: Unsteadiness on feet (R26.81);Difficulty in walking, not elsewhere classified (R26.2);Other symptoms and signs involving the nervous system (V03.500)     Time: 9381-8299 PT Time Calculation (min) (ACUTE ONLY): 40 min  Charges:  $Gait Training: 8-22 mins $Therapeutic Exercise: 23-37 mins                     D. Scott Rexanne Inocencio PT, DPT 01/11/21, 1:10 PM

## 2021-01-12 ENCOUNTER — Other Ambulatory Visit: Payer: Self-pay | Admitting: Family Medicine

## 2021-01-12 MED ORDER — CLOPIDOGREL BISULFATE 75 MG PO TABS: 75.0000 mg | ORAL_TABLET | Freq: Every day | ORAL | 0 refills | Status: DC

## 2021-01-12 MED ORDER — ATORVASTATIN CALCIUM 80 MG PO TABS: 80.0000 mg | ORAL_TABLET | Freq: Every day | ORAL | 11 refills | Status: DC

## 2021-01-12 MED ORDER — ASPIRIN 81 MG PO TBEC: 81.0000 mg | DELAYED_RELEASE_TABLET | Freq: Every day | ORAL | 11 refills | Status: DC

## 2021-01-12 MED ORDER — LISINOPRIL 10 MG PO TABS: 10.0000 mg | ORAL_TABLET | Freq: Every day | ORAL | 11 refills | Status: DC

## 2021-01-12 NOTE — Progress Notes (Signed)
CM set the patient up for [facility] PT, OT, Speech, SW and nurse aide thru [facility] Rolling walker taken into the patient's room for him to take home with him [facility] application provided and referral completed, Meds will be provided by med Mgt

## 2021-01-12 NOTE — Progress Notes (Signed)
Physical Therapy Treatment Patient Details Name: Dalton Wang MRN: [id number] DOB: 1961-02-07 Today's Date: 01/12/2021    History of Present Illness Pt is a 60 y.o. male with a past medical history of hypertension, hyperlipidemia, gastric reflux, presents to the emergency department for confusion.  MD assessment includes AMS and acute CVA.  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    PT Comments    Pt was pleasant and motivated to participate during the session.  Pt presented with grossly improved balance during ambulation without an AD but continued to present with drifting L/R and decreased cadence especially compared to gait training with the RW.  Pt was steady ascending/descending 4 steps with BUEs on the R rail. Pt was steady during dynamic balance training with feet apart and together but required physical assistance at times for stability while reaching outside BOS in semi-tandem.  Pt's SpO2 and HR were WNL throughout the session.  Pt will benefit from HHPT services upon discharge to safely address deficits listed in patient problem list for decreased caregiver assistance and eventual return to PLOF.     Follow Up Recommendations  Home health PT;Supervision for mobility/OOB     Equipment Recommendations  None recommended by PT    Recommendations for Other Services       Precautions / Restrictions Precautions Precautions: Fall Restrictions Weight Bearing Restrictions: No    Mobility  Bed Mobility Overal bed mobility: Independent                  Transfers Overall transfer level: Needs assistance Equipment used: Rolling walker (2 wheeled);None Transfers: Sit to/from Stand Sit to Stand: Supervision         General transfer comment: Fair eccentric and concentric control and  stability  Ambulation/Gait Ambulation/Gait assistance: Min guard Gait Distance (Feet): 400 Feet Assistive device: None;Rolling walker (2 wheeled) Gait Pattern/deviations: Step-through pattern;Decreased step length - right;Decreased step length - left;Drifts right/left;Narrow base of support Gait velocity: decreased   General Gait Details: Pt continued to present with occasional mild drifting left/right during amb without an AD most notably during dynamic gait training but did not require physical assist for stability this session; significant improvement in gait quality/stability with RW with pt advised to use RW upon d/c home until cleared by HHPT; mod verbal cues for sequencing with the RW for general safety   Stairs: SBA ascending/descending 4 steps with one UE on R rail and then BUEs on the R rail with grossly improved confidence and stability with BUE support.              Wheelchair Mobility    Modified Rankin (Stroke Patients Only)       Balance Overall balance assessment: Needs assistance   Sitting balance-Leahy Scale: Normal     Standing balance support: No upper extremity supported;During functional activity Standing balance-Leahy Scale: Fair Standing balance comment: one LOB in ambulation, from which pt quickly recovered. Brushed objects on L side.               High Level Balance Comments: Min A x 2 for stability during dynamic standing balance training in semi-tandem position            Cognition Arousal/Alertness: Awake/alert Behavior During Therapy: WFL for tasks assessed/performed Overall Cognitive Status: No family/caregiver present to determine baseline cognitive functioning  Following Commands: Follows multi-step commands with increased time     Problem Solving: Slow processing        Exercises Other Exercises Other Exercises: Dynamic gait training without AD with start/stops, head turns, and 180 deg  turns Other Exercises: Dynamic standing balance training with reaching outside BOS with feet apart, together, and semi-tandem Other Exercises: Educ re: signs of stroke, D/C planning, falls prevention    General Comments        Pertinent Vitals/Pain Pain Assessment: No/denies pain    Home Living Family/patient expects to be discharged to:: Private residence Living Arrangements: Alone Available Help at Discharge: Family;Available PRN/intermittently Type of Home: House Home Access: Stairs to enter Entrance Stairs-Rails: Right Home Layout: One level        Prior Function Level of Independence: Independent      Comments: Ind amb community distances, no fall history, Ind with ADLs   PT Goals (current goals can now be found in the care plan section) Acute Rehab PT Goals Patient Stated Goal: To walk better and return home Progress towards PT goals: Progressing toward goals    Frequency    7X/week      PT Plan Current plan remains appropriate    Co-evaluation              AM-PAC PT "6 Clicks" Mobility   Outcome Measure  Help needed turning from your back to your side while in a flat bed without using bedrails?: None Help needed moving from lying on your back to sitting on the side of a flat bed without using bedrails?: None Help needed moving to and from a bed to a chair (including a wheelchair)?: A Little Help needed standing up from a chair using your arms (e.g., wheelchair or bedside chair)?: A Little Help needed to walk in hospital room?: A Little Help needed climbing 3-5 steps with a railing? : A Little 6 Click Score: 20    End of Session Equipment Utilized During Treatment: Gait belt Activity Tolerance: Patient tolerated treatment well Patient left: in chair;with call bell/phone within reach;with chair alarm set Nurse Communication: Mobility status PT Visit Diagnosis: Unsteadiness on feet (R26.81);Difficulty in walking, not elsewhere classified  (R26.2);Other symptoms and signs involving the nervous system (L93.790)     Time: 2409-7353 PT Time Calculation (min) (ACUTE ONLY): 28 min  Charges:  $Gait Training: 8-22 mins $Therapeutic Exercise: 8-22 mins                     D. Scott Tanner PT, DPT 01/12/21, 1:27 PM

## 2021-01-12 NOTE — Discharge Summary (Signed)
Physician Discharge Summary  KENDEN BRANDT ZOX:[id number] DOB: 1961-05-03 DOA: 01/10/2021  PCP: Patient, No Pcp Per  Admit date: 01/10/2021 Discharge date: 01/12/2021  Admitted From: Home  Disposition:  Home with [facility]   Recommendations for Outpatient Follow-up:  1. Follow up with new PCP as soon as possible 2. Please follow up with Neurology, Dr. Sherryll Burger in [place] 3. New PCP: Please obtain lipids in 3 months, LFTs/CK as per personal practice        Home Health: PT/OT/SLP due to ongoing deficits from new stroke Equipment/Devices: None new  Discharge Condition: Good  CODE STATUS: FULL Diet recommendation: Cardiac low fat  Brief/Interim Summary: Mr. Holte is a 60 y.o. M with HTN who presented with acute onset aphasia superimposed on several longer term neurological deficits.  Daughter reported first noticing gait concerns (or stumbling?) along with speech difficulties last Sept.  This was tolerable and so he delayed seeking care until this week, new symptoms occurred or symptoms worsened, and so patient's daughter brought him in.  In the ER, MRI brain showed acute infarction in the left basal ganglia and white matter tracts, along with a punctate acute infarction in the right external capsule on a background of extensive chronic small vessel ischemic change.   Given aspirin and admitted.         PRINCIPAL HOSPITAL DIAGNOSIS: Acute stroke    Discharge Diagnoses:   Acute likely small vessel acute ischemic stroke, multifocal Non-invasive angiography showed extensive atherosclerosis intracranially, with worsening disease burden in smaller vessels.  Echocardiogram showed no cardiogenic source of embolism.  Carotid imaging without high grade stenosis.    Lipids ordered: discharged on high dose atorvastatin.  Aspirin ordered at admission --> discharged on aspirin and Plavix for three Schrimpf followed by aspirin alone indefinitely  Atrial fibrillation: not detercted on  telemetry  tPA not given because outside window  Dysphagia screen ordered in ER.  PT eval ordered: recommended HHPT.  Smoking cessation: not pertinent.  Hypertension Discharged on new lisinopril 10 mg daily.  Recommended new PCP strongly.       Discharge Instructions  Discharge Instructions    Diet - low sodium heart healthy   Complete by: As directed    Discharge instructions   Complete by: As directed    You were admitted for a stroke  You should take the following treatments: 1. Take a cholesterol medicine, atorvastatin Take atorvastatin 80 mg nightly Have your doctor check your cholesterol in 3 months  2. Take aspirin and Plavix/clopidogrel to "grease" the arteries and clotting cells Take aspirin 81 mg from now on Take clopidogrel/Plavix 75 mg daily for 3 Saal then stop Go see a Neurologist in 1-2 months, for example Dr. Sherryll Burger listed below  3. Control your blood pressure Start lisinopril 10 mg daily Go see a new primary care doctor as soon as possible   Increase activity slowly   Complete by: As directed      Allergies as of 01/12/2021   No Known Allergies     Medication List    TAKE these medications   aspirin 81 MG EC tablet Take 1 tablet (81 mg total) by mouth daily. Swallow whole. Start taking on: January 13, 2021   atorvastatin 80 MG tablet Commonly known as: LIPITOR Take 1 tablet (80 mg total) by mouth daily. Start taking on: January 13, 2021   clopidogrel 75 MG tablet Commonly known as: PLAVIX Take 1 tablet (75 mg total) by mouth daily. Start taking on: January 13, 2021   lisinopril 10 MG tablet Commonly known as: ZESTRIL Take 1 tablet (10 mg total) by mouth daily. Start taking on: January 13, 2021            Durable Medical Equipment  (From admission, onward)         Start     Ordered   01/11/21 1629  For home use only DME Walker rolling  Once       Question Answer Comment  Walker: With 5 Inch Wheels   Patient needs a walker to treat  with the following condition CVA (cerebral vascular accident) (HCC)      01/11/21 1628          Follow-up Information    Lonell FaceShah, Hemang K, MD. Schedule an appointment as soon as possible for a visit in 1 month(s).   Specialty: Neurology Contact information: [address] [facility] [place] [address] [phone]        [facility]. Schedule an appointment as soon as possible for a visit in 1 week(s).   Why: For a new PCP             No Known Allergies  Consultations:  Neurology   Procedures/Studies: CT Head Wo Contrast  Result Date: 01/10/2021 CLINICAL DATA:  Mental status change EXAM: CT HEAD WITHOUT CONTRAST TECHNIQUE: Contiguous axial images were obtained from the base of the skull through the vertex without intravenous contrast. COMPARISON:  08/25/2011 FINDINGS: Brain: No evidence of acute infarction, hemorrhage, hydrocephalus, extra-axial collection or mass lesion/mass effect. Extensive chronic small vessel ischemic disease is identified within bilateral cerebral hemisphere. Multiple lacunar infarcts are identified within the subcortical white matter of the right frontal lobe, right parietal lobe, and left posterior frontal lobe. Chronic appearing right basal ganglia and right thalamic lacunar infarcts are noted. In the left basal ganglia there is a asymmetric area of low attenuation measuring 2 cm compatible with subacute to chronic infarct, image 17/2. Vascular: No hyperdense vessel or unexpected calcification. Skull: None Sinuses/Orbits: No acute finding. Other: None. IMPRESSION: 1. No acute intracranial abnormalities. 2. Chronic small vessel ischemic disease and brain atrophy. 3. 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 Electronically Signed   By: Signa Kellaylor  Stroud M.D.   On: 01/10/2021 11:59   MR BRAIN WO CONTRAST  Result Date: 01/10/2021 CLINICAL DATA:  Mental status changes.  Confusion. EXAM: MRI HEAD  WITHOUT CONTRAST TECHNIQUE: Multiplanar, multiecho pulse sequences of the brain and surrounding structures were obtained without intravenous contrast. COMPARISON:  Head CT same day FINDINGS: Brain: Diffusion imaging shows a punctate acute infarction in the external capsule and within the inferior basal ganglia on the right. On the left, there is a larger region of acute infarction, measuring up to 2.7 cm in size, affecting the basal ganglia and radiating white matter tracts. Elsewhere, there are old small vessel infarctions within both sides of the pons. Old insults within both middle cerebellar peduncles. Multiple old small vessel thalamic infarctions. Old small vessel basal ganglia infarctions. Moderate chronic small-vessel ischemic changes of the hemispheric white. No cortical or large vessel territory infarction. No mass lesion, hemorrhage, hydrocephalus or extra-axial collection. Vascular: Major vessels at the base of the brain show flow. Skull and upper cervical spine: Negative Sinuses/Orbits: Clear/normal Other: None IMPRESSION: Background pattern of extensive chronic small vessel ischemic changes throughout the brain as outlined above. 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 Punctate  acute infarction in the right external capsule and inferior basal ganglia on the right. Electronically Signed   By: Paulina Fusi M.D.   On: 01/10/2021 14:31   ECHOCARDIOGRAM COMPLETE BUBBLE STUDY  Result Date: 01/11/2021    ECHOCARDIOGRAM REPORT   Patient Name:   QUIRINO KAKOS Date of Exam: 01/11/2021 Medical Rec #:  [id number]    Height:       74.0 in Accession #:    [id number]   Weight:       192.4 lb Date of Birth:  10-23-1961     BSA:          2.138 m Patient Age:    60 years     BP:           123/83 mmHg Patient Gender: M            HR:           52 bpm. Exam Location:  [facility] Procedure: 2D Echo, Cardiac Doppler, Color Doppler, Saline Contrast Bubble Study             and Strain Analysis Indications:     Stroke 434.91 / I63.9  History:         Patient has no prior history of Echocardiogram examinations.                  Risk Factors:Hypertension.  Sonographer:     Cristela Blue RDCS (AE) Referring Phys:  [id number] AMY N COX Diagnosing Phys: Lorine Bears MD  Sonographer Comments: Global longitudinal strain was attempted. IMPRESSIONS  1. Left ventricular ejection fraction, by estimation, is 55 to 60%. The left ventricle has normal function. The left ventricle has no regional wall motion abnormalities. There is mild left ventricular hypertrophy. Left ventricular diastolic parameters are consistent with Grade I diastolic dysfunction (impaired relaxation). The average left ventricular global longitudinal strain is -19.7 %. The global longitudinal strain is normal.  2. Right ventricular systolic function is normal. The right ventricular size is normal. Tricuspid regurgitation signal is inadequate for assessing PA pressure.  3. Left atrial size was mildly dilated.  4. The mitral valve is normal in structure. No evidence of mitral valve regurgitation. No evidence of mitral stenosis.  5. The aortic valve is abnormal. Aortic valve regurgitation is trivial. Mild to moderate aortic valve sclerosis/calcification is present, without any evidence of aortic stenosis.  6. Agitated saline contrast bubble study was negative, with no evidence of any interatrial shunt. FINDINGS  Left Ventricle: Left ventricular ejection fraction, by estimation, is 55 to 60%. The left ventricle has normal function. The left ventricle has no regional wall motion abnormalities. The average left ventricular global longitudinal strain is -19.7 %. The global longitudinal strain is normal. The left ventricular internal cavity size was normal in size. There is mild left ventricular hypertrophy. Left ventricular diastolic parameters are consistent with Grade I diastolic dysfunction (impaired relaxation). Right  Ventricle: The right ventricular size is normal. No increase in right ventricular wall thickness. Right ventricular systolic function is normal. Tricuspid regurgitation signal is inadequate for assessing PA pressure. Left Atrium: Left atrial size was mildly dilated. Right Atrium: Right atrial size was normal in size. Pericardium: There is no evidence of pericardial effusion. Mitral Valve: The mitral valve is normal in structure. No evidence of mitral valve regurgitation. No evidence of mitral valve stenosis. Tricuspid Valve: The tricuspid valve is normal in structure. Tricuspid valve regurgitation is not demonstrated. No evidence of tricuspid stenosis. Aortic Valve: The aortic valve  is abnormal. Aortic valve regurgitation is trivial. Mild to moderate aortic valve sclerosis/calcification is present, without any evidence of aortic stenosis. Aortic valve mean gradient measures 10.0 mmHg. Aortic valve peak  gradient measures 17.8 mmHg. Aortic valve area, by VTI measures 2.43 cm. Pulmonic Valve: The pulmonic valve was normal in structure. Pulmonic valve regurgitation is not visualized. No evidence of pulmonic stenosis. Aorta: The aortic root is normal in size and structure. Venous: The inferior vena cava was not well visualized. IAS/Shunts: No atrial level shunt detected by color flow Doppler. Agitated saline contrast was given intravenously to evaluate for intracardiac shunting. Agitated saline contrast bubble study was negative, with no evidence of any interatrial shunt.  LEFT VENTRICLE PLAX 2D LVIDd:         3.89 cm  Diastology LVIDs:         2.69 cm  LV e' medial:    9.25 cm/s LV PW:         1.80 cm  LV E/e' medial:  10.4 LV IVS:        1.01 cm  LV e' lateral:   12.90 cm/s LVOT diam:     2.20 cm  LV E/e' lateral: 7.5 LV SV:         88 LV SV Index:   41       2D Longitudinal Strain LVOT Area:     3.80 cm 2D Strain GLS Avg:     -19.7 %                          3D Volume EF:                         3D EF:        60 %                          LV EDV:       167 ml                         LV ESV:       67 ml                         LV SV:        100 ml RIGHT VENTRICLE RV Basal diam:  4.11 cm RV S prime:     12.60 cm/s TAPSE (M-mode): 4.1 cm LEFT ATRIUM             Index       RIGHT ATRIUM           Index LA diam:        3.60 cm 1.68 cm/m  RA Area:     22.00 cm LA Vol (A2C):   77.4 ml 36.20 ml/m RA Volume:   69.00 ml  32.27 ml/m LA Vol (A4C):   32.4 ml 15.15 ml/m LA Biplane Vol: 55.5 ml 25.96 ml/m  AORTIC VALVE                    PULMONIC VALVE AV Area (Vmax):    1.95 cm     PV Vmax:        0.89 m/s AV Area (Vmean):   1.89 cm     PV Peak grad:   3.2 mmHg AV Area (VTI):  2.43 cm     RVOT Peak grad: 5 mmHg AV Vmax:           211.00 cm/s AV Vmean:          145.000 cm/s AV VTI:            0.361 m AV Peak Grad:      17.8 mmHg AV Mean Grad:      10.0 mmHg LVOT Vmax:         108.00 cm/s LVOT Vmean:        72.100 cm/s LVOT VTI:          0.231 m LVOT/AV VTI ratio: 0.64  AORTA Ao Root diam: 3.00 cm MITRAL VALVE                TRICUSPID VALVE MV Area (PHT): 2.92 cm     TR Peak grad:   10.8 mmHg MV Decel Time: 260 msec     TR Vmax:        164.00 cm/s MV E velocity: 96.40 cm/s MV A velocity: 124.00 cm/s  SHUNTS MV E/A ratio:  0.78         Systemic VTI:  0.23 m                             Systemic Diam: 2.20 cm Lorine Bears MD Electronically signed by Lorine Bears MD Signature Date/Time: 01/11/2021/5:06:38 PM    Final    CT ANGIO HEAD CODE STROKE  Result Date: 01/10/2021 CLINICAL DATA:  Follow-up stroke shown earlier today by MRI. EXAM: CT ANGIOGRAPHY HEAD AND NECK TECHNIQUE: Multidetector CT imaging of the head and neck was performed using the standard protocol during bolus administration of intravenous contrast. Multiplanar CT image reconstructions and MIPs were obtained to evaluate the vascular anatomy. Carotid stenosis measurements (when applicable) are obtained utilizing NASCET criteria, using the distal internal carotid  diameter as the denominator. CONTRAST:  48mL OMNIPAQUE IOHEXOL 350 MG/ML SOLN COMPARISON:  MR and CT studies earlier today. FINDINGS: CTA NECK FINDINGS Aortic arch: Some aortic atherosclerosis. Branching pattern is normal without origin stenosis. Right carotid system: Common carotid artery widely patent to the bifurcation. Soft and calcified plaque at the carotid bifurcation but no stenosis. Cervical ICA widely patent. Left carotid system: Common carotid artery widely patent to the bifurcation. Soft and calcified plaque at the carotid bifurcation. Mild irregularity that could possibly be a source of embolic disease. No stenosis compared to the more distal cervical ICA diameter. Vertebral arteries: Both vertebral artery origins are patent without stenosis greater than 30%. Both vertebral arteries are patent through the cervical region to the foramen magnum. Skeleton: Ordinary cervical spondylosis. Other neck: No mass or lymphadenopathy. Upper chest: Lung apices are clear. Review of the MIP images confirms the above findings CTA HEAD FINDINGS Anterior circulation: Both internal carotid arteries are patent through the skull base and siphon regions. Ordinary siphon atherosclerotic calcification but without stenosis greater than 30%. The anterior and middle cerebral vessels are patent without proximal stenosis, aneurysm or vascular malformation. Diminutive A1 segment, probably congenital. No visible large or medium vessel occlusion. More distal branch vessels show atherosclerotic irregularity. Posterior circulation: Both vertebral arteries are patent through the foramen magnum. There is atherosclerotic calcification in both vertebral artery V4 segments, with stenosis estimated at 50% on both sides. Both vessels do reach the basilar. No basilar stenosis. Posterior circulation branch vessels are patent. More distal branch vessels show atherosclerotic irregularity. Venous sinuses:  Patent and normal. Anatomic variants: None  significant. Review of the MIP images confirms the above findings IMPRESSION: 1. Atherosclerotic disease at both carotid bifurcations but without stenosis. Mild irregularity on the left that could possibly be a source of embolic disease. 2. Atherosclerotic calcification in both vertebral artery V4 segments, with stenosis estimated at 50% on both sides. 3. No intracranial large or medium vessel occlusion or correctable proximal stenosis. 4. Widespread intracranial distal vessel atherosclerotic irregularity. Aortic Atherosclerosis (ICD10-I70.0). Electronically Signed   By: Paulina Fusi M.D.   On: 01/10/2021 18:13   CT ANGIO NECK CODE STROKE  Result Date: 01/10/2021 CLINICAL DATA:  Follow-up stroke shown earlier today by MRI. EXAM: CT ANGIOGRAPHY HEAD AND NECK TECHNIQUE: Multidetector CT imaging of the head and neck was performed using the standard protocol during bolus administration of intravenous contrast. Multiplanar CT image reconstructions and MIPs were obtained to evaluate the vascular anatomy. Carotid stenosis measurements (when applicable) are obtained utilizing NASCET criteria, using the distal internal carotid diameter as the denominator. CONTRAST:  75mL OMNIPAQUE IOHEXOL 350 MG/ML SOLN COMPARISON:  MR and CT studies earlier today. FINDINGS: CTA NECK FINDINGS Aortic arch: Some aortic atherosclerosis. Branching pattern is normal without origin stenosis. Right carotid system: Common carotid artery widely patent to the bifurcation. Soft and calcified plaque at the carotid bifurcation but no stenosis. Cervical ICA widely patent. Left carotid system: Common carotid artery widely patent to the bifurcation. Soft and calcified plaque at the carotid bifurcation. Mild irregularity that could possibly be a source of embolic disease. No stenosis compared to the more distal cervical ICA diameter. Vertebral arteries: Both vertebral artery origins are patent without stenosis greater than 30%. Both vertebral arteries  are patent through the cervical region to the foramen magnum. Skeleton: Ordinary cervical spondylosis. Other neck: No mass or lymphadenopathy. Upper chest: Lung apices are clear. Review of the MIP images confirms the above findings CTA HEAD FINDINGS Anterior circulation: Both internal carotid arteries are patent through the skull base and siphon regions. Ordinary siphon atherosclerotic calcification but without stenosis greater than 30%. The anterior and middle cerebral vessels are patent without proximal stenosis, aneurysm or vascular malformation. Diminutive A1 segment, probably congenital. No visible large or medium vessel occlusion. More distal branch vessels show atherosclerotic irregularity. Posterior circulation: Both vertebral arteries are patent through the foramen magnum. There is atherosclerotic calcification in both vertebral artery V4 segments, with stenosis estimated at 50% on both sides. Both vessels do reach the basilar. No basilar stenosis. Posterior circulation branch vessels are patent. More distal branch vessels show atherosclerotic irregularity. Venous sinuses: Patent and normal. Anatomic variants: None significant. Review of the MIP images confirms the above findings IMPRESSION: 1. Atherosclerotic disease at both carotid bifurcations but without stenosis. Mild irregularity on the left that could possibly be a source of embolic disease. 2. Atherosclerotic calcification in both vertebral artery V4 segments, with stenosis estimated at 50% on both sides. 3. No intracranial large or medium vessel occlusion or correctable proximal stenosis. 4. Widespread intracranial distal vessel atherosclerotic irregularity. Aortic Atherosclerosis (ICD10-I70.0). Electronically Signed   By: Paulina Fusi M.D.   On: 01/10/2021 18:13       Subjective: Feeling well.  No new speech change, no new focal weakness, no seizures.  No fever.  Discharge Exam: Vitals:   01/12/21 0758 01/12/21 1122  BP: 108/77 110/74   Pulse: (!) 51 (!) 57  Resp: 18 16  Temp: 98.2 F (36.8 C) 97.9 F (36.6 C)  SpO2: 100% 100%   Vitals:  01/12/21 0025 01/12/21 0454 01/12/21 0758 01/12/21 1122  BP: 118/81 100/68 108/77 110/74  Pulse: (!) 56 (!) 52 (!) 51 (!) 57  Resp: Temp: 97.6 F (36.4 C) (!) 97.5 F (36.4 C) 98.2 F (36.8 C) 97.9 F (36.6 C)  TempSrc: Oral  Oral   SpO2: 95% 96% 100% 100%  Weight:      Height:        General: Pt is alert, awake, not in acute distress, sitting in chair Cardiovascular: RRR, nl S1-S2, no murmurs appreciated.   No LE edema.   Respiratory: Normal respiratory rate and rhythm.  CTAB without rales or wheezes. Abdominal: Abdomen soft and non-tender.  No distension or HSM.   Neuro/Psych: Strength symmetric in upper and lower extremities.  Speech is dysarthric.  Judgment and insight appear normal.   The results of significant diagnostics from this hospitalization (including imaging, microbiology, ancillary and laboratory) are listed below for reference.     Microbiology: Recent Results (from the past 240 hour(s))  Resp Panel by RT-PCR (Flu A&B, Covid) Nasopharyngeal Swab     Status: None   Collection Time: 01/10/21 11:18 AM   Specimen: Nasopharyngeal Swab; Nasopharyngeal(NP) swabs in vial transport medium  Result Value Ref Range Status   SARS Coronavirus 2 by RT PCR NEGATIVE NEGATIVE Final    Comment: (NOTE) SARS-CoV-2 target nucleic acids are NOT DETECTED.  The SARS-CoV-2 RNA is generally detectable in upper respiratory specimens during the acute phase of infection. The lowest concentration of SARS-CoV-2 viral copies this assay can detect is 138 copies/mL. A negative result does not preclude SARS-Cov-2 infection and should not be used as the sole basis for treatment or other patient management decisions. A negative result may occur with  improper specimen collection/handling, submission of specimen other than nasopharyngeal swab, presence of viral mutation(s)  within the areas targeted by this assay, and inadequate number of viral copies(<138 copies/mL). A negative result must be combined with clinical observations, patient history, and epidemiological information. The expected result is Negative.  Fact Sheet for Patients:  BloggerCourse.com  Fact Sheet for Healthcare Providers:  SeriousBroker.it  This test is no t yet approved or cleared by the [place] FDA and  has been authorized for detection and/or diagnosis of SARS-CoV-2 by FDA under an Emergency Use Authorization (EUA). This EUA will remain  in effect (meaning this test can be used) for the duration of the COVID-19 declaration under Section 564(b)(1) of the Act, 21 U.S.C.section 360bbb-3(b)(1), unless the authorization is terminated  or revoked sooner.       Influenza A by PCR NEGATIVE NEGATIVE Final   Influenza B by PCR NEGATIVE NEGATIVE Final    Comment: (NOTE) The Xpert Xpress SARS-CoV-2/FLU/RSV plus assay is intended as an aid in the diagnosis of influenza from Nasopharyngeal swab specimens and should not be used as a sole basis for treatment. Nasal washings and aspirates are unacceptable for Xpert Xpress SARS-CoV-2/FLU/RSV testing.  Fact Sheet for Patients: BloggerCourse.com  Fact Sheet for Healthcare Providers: SeriousBroker.it  This test is not yet approved or cleared by the [place] FDA and has been authorized for detection and/or diagnosis of SARS-CoV-2 by FDA under an Emergency Use Authorization (EUA). This EUA will remain in effect (meaning this test can be used) for the duration of the COVID-19 declaration under Section 564(b)(1) of the Act, 21 U.S.C. section 360bbb-3(b)(1), unless the authorization is terminated or revoked.  Performed at [facility], [address] [place], [place] [address]  Labs: BNP (last 3 results) No results  for input(s): BNP in the last 8760 hours. Basic Metabolic Panel: Recent Labs  Lab 01/10/21 1021 01/11/21 0452  NA 141 139  K 3.5 3.4*  CL 106 107  CO2 26 25  GLUCOSE 101* 90  BUN 11 11  CREATININE 0.98 0.78  CALCIUM 9.1 8.8*   Liver Function Tests: Recent Labs  Lab 01/10/21 1021  AST 19  ALT 18  ALKPHOS 57  BILITOT 0.6  PROT 7.3  ALBUMIN 4.3   No results for input(s): LIPASE, AMYLASE in the last 168 hours. No results for input(s): AMMONIA in the last 168 hours. CBC: Recent Labs  Lab 01/10/21 1021 01/11/21 0452  WBC 4.2 4.8  HGB 15.9 15.1  HCT 48.1 43.8  MCV 88.6 87.8  PLT 297 276   Cardiac Enzymes: No results for input(s): CKTOTAL, CKMB, CKMBINDEX, TROPONINI in the last 168 hours. BNP: Invalid input(s): POCBNP CBG: No results for input(s): GLUCAP in the last 168 hours. D-Dimer No results for input(s): DDIMER in the last 72 hours. Hgb A1c Recent Labs    01/11/21 0452  HGBA1C 5.2   Lipid Profile Recent Labs    01/11/21 0452  CHOL 204*  HDL 41  LDLCALC 147*  TRIG 79  CHOLHDL 5.0   Thyroid function studies Recent Labs    01/11/21 0452  TSH 2.357   Anemia work up No results for input(s): VITAMINB12, FOLATE, FERRITIN, TIBC, IRON, RETICCTPCT in the last 72 hours. Urinalysis    Component Value Date/Time   COLORURINE YELLOW (A) 01/10/2021 1118   APPEARANCEUR CLEAR (A) 01/10/2021 1118   LABSPEC 1.020 01/10/2021 1118   PHURINE 6.0 01/10/2021 1118   GLUCOSEU NEGATIVE 01/10/2021 1118   HGBUR NEGATIVE 01/10/2021 1118   BILIRUBINUR NEGATIVE 01/10/2021 1118   KETONESUR 5 (A) 01/10/2021 1118   PROTEINUR NEGATIVE 01/10/2021 1118   NITRITE NEGATIVE 01/10/2021 1118   LEUKOCYTESUR NEGATIVE 01/10/2021 1118   Sepsis Labs Invalid input(s): PROCALCITONIN,  WBC,  LACTICIDVEN Microbiology Recent Results (from the past 240 hour(s))  Resp Panel by RT-PCR (Flu A&B, Covid) Nasopharyngeal Swab     Status: None   Collection Time: 01/10/21 11:18 AM    Specimen: Nasopharyngeal Swab; Nasopharyngeal(NP) swabs in vial transport medium  Result Value Ref Range Status   SARS Coronavirus 2 by RT PCR NEGATIVE NEGATIVE Final    Comment: (NOTE) SARS-CoV-2 target nucleic acids are NOT DETECTED.  The SARS-CoV-2 RNA is generally detectable in upper respiratory specimens during the acute phase of infection. The lowest concentration of SARS-CoV-2 viral copies this assay can detect is 138 copies/mL. A negative result does not preclude SARS-Cov-2 infection and should not be used as the sole basis for treatment or other patient management decisions. A negative result may occur with  improper specimen collection/handling, submission of specimen other than nasopharyngeal swab, presence of viral mutation(s) within the areas targeted by this assay, and inadequate number of viral copies(<138 copies/mL). A negative result must be combined with clinical observations, patient history, and epidemiological information. The expected result is Negative.  Fact Sheet for Patients:  BloggerCourse.com  Fact Sheet for Healthcare Providers:  SeriousBroker.it  This test is no t yet approved or cleared by the [place] FDA and  has been authorized for detection and/or diagnosis of SARS-CoV-2 by FDA under an Emergency Use Authorization (EUA). This EUA will remain  in effect (meaning this test can be used) for the duration of the COVID-19 declaration under Section 564(b)(1) of the Act, 21  U.S.C.section 360bbb-3(b)(1), unless the authorization is terminated  or revoked sooner.       Influenza A by PCR NEGATIVE NEGATIVE Final   Influenza B by PCR NEGATIVE NEGATIVE Final    Comment: (NOTE) The Xpert Xpress SARS-CoV-2/FLU/RSV plus assay is intended as an aid in the diagnosis of influenza from Nasopharyngeal swab specimens and should not be used as a sole basis for treatment. Nasal washings and aspirates are  unacceptable for Xpert Xpress SARS-CoV-2/FLU/RSV testing.  Fact Sheet for Patients: BloggerCourse.com  Fact Sheet for Healthcare Providers: SeriousBroker.it  This test is not yet approved or cleared by the [place] FDA and has been authorized for detection and/or diagnosis of SARS-CoV-2 by FDA under an Emergency Use Authorization (EUA). This EUA will remain in effect (meaning this test can be used) for the duration of the COVID-19 declaration under Section 564(b)(1) of the Act, 21 U.S.C. section 360bbb-3(b)(1), unless the authorization is terminated or revoked.  Performed at [facility], [address] [place], [place] [address]      Time coordinating discharge: 25 minutes       SIGNED:   Alberteen Sam, MD  [facility] 01/12/2021, 12:24 PM
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
## 2021-01-12 NOTE — Progress Notes (Signed)
Occupational Therapy Treatment Patient Details Name: Dalton Wang MRN: [id number] DOB: September 13, 1961 Today's Date: 01/12/2021    History of present illness Pt is a 60 y.o. male with a past medical history of hypertension, hyperlipidemia, gastric reflux, presents to the emergency department for confusion.  MD assessment includes AMS and acute CVA.  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   OT comments  Dalton Wang was very pleasant and put in good effort during today's OT session. He was able to engage in grooming, dressing, bathing, from seated and standing positions with no LOB, occasionally using one hand to steady himself while standing at sink. No difficulties with bed mobility or sit<>stand. Dynamic standing balance, however, is only fair, with pt demonstrating occasional LOB and running into objects on L side. Pt continues to display word finding difficulty and slurred speech. A&Ox4. Pt became tearful during session, stating that he was lonely and afraid. Provided therapeutic listening and will put in a referral for chaplain visit. Recommend either HHOT or outpatient OT post D/C (some question as to whether pt is comfortable w/ someone coming into his home).    Follow Up Recommendations  Home health OT;Supervision - Intermittent    Equipment Recommendations  None recommended by OT    Recommendations for Other Services      Precautions / Restrictions Precautions Precautions: Fall Restrictions Weight Bearing Restrictions: No       Mobility Bed Mobility Overal bed mobility: Independent                  Transfers Overall transfer level: Needs assistance Equipment used: None Transfers: Sit to/from Stand Sit to Stand: Supervision         General transfer comment: minimal sway initially    Balance Overall balance  assessment: Needs assistance   Sitting balance-Leahy Scale: Normal     Standing balance support: No upper extremity supported;During functional activity Standing balance-Leahy Scale: Fair Standing balance comment: LOB in ambulation, from which pt quickly recovered. Brushed objects on L side.                           ADL either performed or assessed with clinical judgement   ADL Overall ADL's : Needs assistance/impaired Eating/Feeding: Independent   Grooming: Wash/dry face;Wash/dry hands;Oral care;Brushing hair;Standing Grooming Details (indicate cue type and reason): in standing at sink, ~ 5 minutes, w/ 1 hand on sink for UE support             Lower Body Dressing: Supervision/safety Lower Body Dressing Details (indicate cue type and reason): donning/doffing socks             Functional mobility during ADLs: Min guard       Vision Patient Visual Report: No change from baseline Additional Comments: Pt reports no change from baseline. While ambulating in room, however, ran into object on L side -- possible peripheral vision issue?   Perception     Praxis      Cognition Arousal/Alertness: Awake/alert Behavior During Therapy: WFL for tasks assessed/performed Overall Cognitive Status: No family/caregiver present to determine baseline cognitive functioning                         Following Commands: Follows multi-step commands with increased time     Problem Solving: Slow processing  Exercises Other Exercises Other Exercises: Educ re: signs of stroke, D/C planning, falls prevention   Shoulder Instructions       General Comments      Pertinent Vitals/ Pain       Pain Assessment: No/denies pain  Home Living Family/patient expects to be discharged to:: Private residence Living Arrangements: Alone Available Help at Discharge: Family;Available PRN/intermittently Type of Home: House Home Access: Stairs to enter [employer] of Steps: 2 Entrance Stairs-Rails: Right Home Layout: One level     Bathroom Shower/Tub: Chief Strategy Officer: Standard            Lives With: Alone    Prior Functioning/Environment Level of Independence: Independent        Comments: Ind amb community distances, no fall history, Ind with ADLs   Frequency  Min 2X/week        Progress Toward Goals  OT Goals(current goals can now be found in the care plan section)  Progress towards OT goals: Progressing toward goals  Acute Rehab OT Goals Patient Stated Goal: To walk better and return home OT Goal Formulation: With patient/family Time For Goal Achievement: 01/25/21 Potential to Achieve Goals: Good  Plan Discharge plan needs to be updated;Frequency remains appropriate    Co-evaluation                 AM-PAC OT "6 Clicks" Daily Activity     Outcome Measure   Help from another person eating meals?: None Help from another person taking care of personal grooming?: None Help from another person toileting, which includes using toliet, bedpan, or urinal?: A Little Help from another person bathing (including washing, rinsing, drying)?: A Little Help from another person to put on and taking off regular upper body clothing?: None Help from another person to put on and taking off regular lower body clothing?: None 6 Click Score: 22    End of Session    OT Visit Diagnosis: Other abnormalities of gait and mobility (R26.89);Cognitive communication deficit (R41.841) Symptoms and signs involving cognitive functions: Cerebral infarction   Activity Tolerance Patient tolerated treatment well   Patient Left in chair;with call bell/phone within reach;with chair alarm set   Nurse Communication          Time: [id number] OT Time Calculation (min): 55 min  Charges: OT General Charges $OT Visit: 1 Visit OT Treatments $Self Care/Home Management : 53-67 mins  Dalton Craver, PhD, MS,  OTR/L 01/12/21, 12:01 PM

## 2021-03-09 ENCOUNTER — Telehealth: Payer: Self-pay | Admitting: Pharmacist

## 2021-03-09 NOTE — Telephone Encounter (Signed)
Patient failed to provide requested 2022 financial documentation. Unable to determine patient's eligibility status for [facility]. No additional medication assistance will be provided by [facility] without the required proof of income documentation. Patient notified by letter.  Vonda [facility] Administrative Assistant 

## 2021-06-08 ENCOUNTER — Observation Stay
Admission: EM | Admit: 2021-06-08 | Discharge: 2021-06-09 | Disposition: A | Discharge status: Home / Self Care | Payer: Self-pay | Attending: Internal Medicine | Admitting: Internal Medicine

## 2021-06-08 ENCOUNTER — Emergency Department: Payer: Self-pay

## 2021-06-08 ENCOUNTER — Other Ambulatory Visit: Payer: Self-pay

## 2021-06-08 ENCOUNTER — Encounter: Payer: Self-pay | Admitting: Emergency Medicine

## 2021-06-08 DIAGNOSIS — E785 Hyperlipidemia, unspecified: Secondary | ICD-10-CM | POA: Diagnosis present

## 2021-06-08 DIAGNOSIS — Z20822 Contact with and (suspected) exposure to covid-19: Secondary | ICD-10-CM | POA: Insufficient documentation

## 2021-06-08 DIAGNOSIS — Z79899 Other long term (current) drug therapy: Secondary | ICD-10-CM | POA: Insufficient documentation

## 2021-06-08 DIAGNOSIS — I160 Hypertensive urgency: Secondary | ICD-10-CM | POA: Diagnosis present

## 2021-06-08 DIAGNOSIS — Z7902 Long term (current) use of antithrombotics/antiplatelets: Secondary | ICD-10-CM | POA: Insufficient documentation

## 2021-06-08 DIAGNOSIS — N179 Acute kidney failure, unspecified: Secondary | ICD-10-CM

## 2021-06-08 DIAGNOSIS — Z7982 Long term (current) use of aspirin: Secondary | ICD-10-CM | POA: Insufficient documentation

## 2021-06-08 DIAGNOSIS — I161 Hypertensive emergency: Secondary | ICD-10-CM

## 2021-06-08 DIAGNOSIS — R519 Headache, unspecified: Secondary | ICD-10-CM | POA: Insufficient documentation

## 2021-06-08 DIAGNOSIS — Z8673 Personal history of transient ischemic attack (TIA), and cerebral infarction without residual deficits: Secondary | ICD-10-CM

## 2021-06-08 DIAGNOSIS — I10 Essential (primary) hypertension: Principal | ICD-10-CM | POA: Insufficient documentation

## 2021-06-08 LAB — CBC
HCT: 49.9 % (ref 39.0–52.0)
Hemoglobin: 16.8 g/dL (ref 13.0–17.0)
MCH: 30.2 pg (ref 26.0–34.0)
MCHC: 33.7 g/dL (ref 30.0–36.0)
MCV: 89.6 fL (ref 80.0–100.0)
Platelets: 293 10*3/uL (ref 150–400)
RBC: 5.57 MIL/uL (ref 4.22–5.81)
RDW: 13.1 % (ref 11.5–15.5)
WBC: 6.7 10*3/uL (ref 4.0–10.5)
nRBC: 0 % (ref 0.0–0.2)

## 2021-06-08 LAB — BASIC METABOLIC PANEL
Anion gap: 10 (ref 5–15)
BUN: 17 mg/dL (ref 6–20)
CO2: 25 mmol/L (ref 22–32)
Calcium: 8.7 mg/dL — ABNORMAL LOW (ref 8.9–10.3)
Chloride: 106 mmol/L (ref 98–111)
Creatinine, Ser: 1.32 mg/dL — ABNORMAL HIGH (ref 0.61–1.24)
GFR, Estimated: >60 mL/min (ref >60)
Glucose, Bld: 118 mg/dL — ABNORMAL HIGH (ref 70–99)
Potassium: 3.6 mmol/L (ref 3.5–5.1)
Sodium: 141 mmol/L (ref 135–145)

## 2021-06-08 LAB — TROPONIN I (HIGH SENSITIVITY)
Troponin I (High Sensitivity): 4 ng/L (ref <18)
Troponin I (High Sensitivity): 4 ng/L (ref <18)

## 2021-06-08 MED ORDER — HYDRALAZINE HCL 20 MG/ML IJ SOLN
10.0000 mg | Freq: Once | INTRAMUSCULAR | Status: AC
  Administered 2021-06-09: 10 mg via INTRAVENOUS
  Filled 2021-06-08: qty 1

## 2021-06-08 MED ORDER — LISINOPRIL 10 MG PO TABS
10.0000 mg | ORAL_TABLET | Freq: Once | ORAL | Status: AC
  Administered 2021-06-08: 10 mg via ORAL
  Filled 2021-06-08: qty 1
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
## 2021-06-08 NOTE — ED Triage Notes (Signed)
Pt comes into the ED via POV c/o HTN.  Pt denies any symptoms other than a headache.  Pt states he used to take BP medicine, but lost his PCP.  Pt neurologically intact and denies any CP, SHOB, or dizziness.

## 2021-06-09 ENCOUNTER — Other Ambulatory Visit: Payer: Self-pay

## 2021-06-09 DIAGNOSIS — I160 Hypertensive urgency: Secondary | ICD-10-CM

## 2021-06-09 DIAGNOSIS — Z8673 Personal history of transient ischemic attack (TIA), and cerebral infarction without residual deficits: Secondary | ICD-10-CM

## 2021-06-09 DIAGNOSIS — N179 Acute kidney failure, unspecified: Secondary | ICD-10-CM

## 2021-06-09 DIAGNOSIS — E785 Hyperlipidemia, unspecified: Secondary | ICD-10-CM

## 2021-06-09 LAB — BASIC METABOLIC PANEL
Anion gap: 4 — ABNORMAL LOW (ref 5–15)
BUN: 12 mg/dL (ref 6–20)
CO2: 25 mmol/L (ref 22–32)
Calcium: 8.9 mg/dL (ref 8.9–10.3)
Chloride: 112 mmol/L — ABNORMAL HIGH (ref 98–111)
Creatinine, Ser: 0.71 mg/dL (ref 0.61–1.24)
GFR, Estimated: >60 mL/min (ref >60)
Glucose, Bld: 98 mg/dL (ref 70–99)
Potassium: 3.2 mmol/L — ABNORMAL LOW (ref 3.5–5.1)
Sodium: 141 mmol/L (ref 135–145)

## 2021-06-09 LAB — SARS CORONAVIRUS 2 (TAT 6-24 HRS): SARS Coronavirus 2: NEGATIVE

## 2021-06-09 MED ORDER — ASPIRIN EC 81 MG PO TBEC
81.0000 mg | DELAYED_RELEASE_TABLET | Freq: Every day | ORAL | Status: DC
  Administered 2021-06-09: 81 mg via ORAL
  Filled 2021-06-09: qty 1

## 2021-06-09 MED ORDER — LISINOPRIL 10 MG PO TABS
10.0000 mg | ORAL_TABLET | Freq: Every day | ORAL | 4 refills | Status: AC
  Filled 2021-06-09: qty 90, 90d supply, fill #0

## 2021-06-09 MED ORDER — ATORVASTATIN CALCIUM 40 MG PO TABS
40.0000 mg | ORAL_TABLET | Freq: Every day | ORAL | 4 refills | Status: AC
  Filled 2021-06-09: qty 90, 90d supply, fill #0

## 2021-06-09 MED ORDER — SODIUM CHLORIDE 0.9 % IV BOLUS: 500.0000 mL | Freq: Once | INTRAVENOUS | Status: DC

## 2021-06-09 MED ORDER — LISINOPRIL 10 MG PO TABS
10.0000 mg | ORAL_TABLET | Freq: Every day | ORAL | Status: DC
  Administered 2021-06-09: 10 mg via ORAL
  Filled 2021-06-09: qty 1

## 2021-06-09 MED ORDER — ENOXAPARIN SODIUM 40 MG/0.4ML IJ SOSY
40.0000 mg | PREFILLED_SYRINGE | INTRAMUSCULAR | Status: DC
  Administered 2021-06-09: 40 mg via SUBCUTANEOUS
  Filled 2021-06-09: qty 0.4

## 2021-06-09 MED ORDER — ATORVASTATIN CALCIUM 80 MG PO TABS
80.0000 mg | ORAL_TABLET | Freq: Every day | ORAL | Status: DC
  Administered 2021-06-09: 80 mg via ORAL
  Filled 2021-06-09: qty 1

## 2021-06-09 MED ORDER — LACTATED RINGERS IV BOLUS
500.0000 mL | Freq: Once | INTRAVENOUS | Status: AC
  Administered 2021-06-09: 500 mL via INTRAVENOUS

## 2021-06-09 MED ORDER — HYDRALAZINE HCL 20 MG/ML IJ SOLN: 10.0000 mg | Freq: Four times a day (QID) | INTRAMUSCULAR | Status: DC | PRN

## 2021-06-09 MED ORDER — ASPIRIN 81 MG PO TBEC
81.0000 mg | DELAYED_RELEASE_TABLET | Freq: Every day | ORAL | 4 refills | Status: AC
  Filled 2021-06-09: qty 90, 90d supply, fill #0

## 2021-06-09 MED ORDER — HYDRALAZINE HCL 20 MG/ML IJ SOLN: 5.0000 mg | Freq: Four times a day (QID) | INTRAMUSCULAR | Status: DC | PRN

## 2021-06-09 MED ORDER — CLOPIDOGREL BISULFATE 75 MG PO TABS
75.0000 mg | ORAL_TABLET | Freq: Every day | ORAL | Status: DC
  Administered 2021-06-09: 75 mg via ORAL
  Filled 2021-06-09: qty 1

## 2021-06-09 NOTE — ED Notes (Signed)
Pt ambulated in room with steady gait. Pt suffered stroke about 3 months ago and underwent PT to relearn to walk. Pt able to walk with no use of ambulatory aids. Denies dizziness. Lives at home alone. Will inform Dr Allena Katz who will come discharge pt from ED.

## 2021-06-09 NOTE — TOC Initial Note (Signed)
Transition of Care [facility]) - Initial/Assessment Note    Patient Details  Name: Dalton Wang MRN: [id number] Date of Birth: [birth year]  [facility] CM/SW Contact:    Joseph Art, LCSWA Phone Number: 06/09/2021, 1:19 PM  Clinical Narrative:                  Patient presents to [facility] due headache and elevated blood pressure.  Patient has a hx of CVA, PVD and hypertension and is currently not working.  Patient has been unable to renew his prescriptions.  [facility], and they were delivered to patient.  CSW gave patient resources packet which includes application for [facility].  [facility] pharmacist sent application form for patient and patient filled out form, CSW faxed back to [facility] [phone]. CSW sent referrals to [facility] and [facility].  ED RN updated.  Patient will discharge home.       Patient Goals and CMS Choice        Expected Discharge Plan and Services           Expected Discharge Date: 06/09/21                                    Prior Living Arrangements/Services                       Activities of Daily Living      Permission Sought/Granted                  Emotional Assessment              Admission diagnosis:  Hypertensive urgency [I16.0] Patient Active Problem List   Diagnosis Date Noted   Hypertensive urgency 06/09/2021   AKI (acute kidney injury) (HCC) 06/09/2021   History of CVA (cerebrovascular accident) 06/09/2021   CVA (cerebral vascular accident) (HCC) 01/11/2021   Stroke (HCC) 01/11/2021   Stroke-like episode 01/10/2021   HLD (hyperlipidemia) 12/18/2007   OBESITY 12/18/2007   Essential hypertension 12/18/2007   PERIPHERAL VASCULAR DISEASE 12/18/2007   GERD 12/18/2007   Sleep apnea 12/18/2007   PCP:  Patient, No Pcp Per (Inactive) Pharmacy:   [facility] [address] [place] [address] Phone: [phone] Fax: [phone]     Social Determinants of Health (SDOH) Interventions    Readmission Risk Interventions No flowsheet data found.
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
## 2021-06-09 NOTE — ED Notes (Signed)
CSW back at bedside. Medication assistance application received by CSW. Medications reviewed with pt and AVS printed and shown to pt. Will discharge shortly.
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
## 2021-06-09 NOTE — ED Notes (Signed)
Pt will be discharged after CSW brings prescriptions to pt, per Dr Allena Katz.

## 2021-06-09 NOTE — ED Notes (Signed)
CSW at bedside with pt. Pt will discharge after this encounter.

## 2021-06-09 NOTE — ED Notes (Signed)
Breakfast tray at bedside. Pt awake, eating breakfast. Asking if can be discharged. Spoke with Dr Allena Katz who will come see pt this morning.

## 2021-06-09 NOTE — ED Notes (Signed)
Covid swab double bagged and sent to lab at this time

## 2021-06-09 NOTE — Discharge Instructions (Signed)
[facility]

## 2021-06-09 NOTE — ED Notes (Signed)
Pt filling out medication assistance paperwork to hand back to CSW when she returns. Pt handed bag with prescription BP meds. Will discharge pt once all complete.

## 2021-06-27 ENCOUNTER — Telehealth: Payer: Self-pay | Admitting: Pharmacy Technician

## 2021-06-27 NOTE — Telephone Encounter (Signed)
Provided patient with new patient packet to obtain [facility] services.  [facility] must receive requested financial documentation within 30 days in order to determine eligibility and provide medication assistance.  Milca Sytsma J. Naomii Kreger Care Manager [facility]  

## 2021-07-14 ENCOUNTER — Telehealth: Payer: Self-pay | Admitting: Gerontology

## 2021-07-14 NOTE — Telephone Encounter (Signed)
Called but did not answer. There was not a mailbox set up to leave a voicemail. Needs to be called at a later time

## 2021-12-02 ENCOUNTER — Telehealth: Payer: Self-pay | Admitting: Pharmacist

## 2021-12-02 NOTE — Telephone Encounter (Signed)
Patient failed to provide requested 2023 financial documentation. No additional medication assistance will be provided by [facility] without the required proof of income documentation. Patient notified by letter. ? ?Deborah Barnes ?Medication Management ?

## 2021-12-06 ENCOUNTER — Telehealth: Payer: Self-pay | Admitting: Pharmacy Technician

## 2021-12-06 NOTE — Telephone Encounter (Signed)
°  [place], [place]  [address]  December 02, 2021    Dear Dalton Wang:  This is to inform you that you are no longer eligible to receive medication assistance at [facility].  The reason(s) are:    _____Your total gross monthly household income exceeds 300% of the Federal Poverty Level.   _____Tangible assets (savings, checking, stocks/bonds, pension, retirement, etc.) exceeds our limit  _____You are eligible to receive benefits from [facility], [place] or HIV Medication              Assistance Program _____You are eligible to receive benefits from a Medicare Part D plan _____You have prescription insurance  _____You are not an [facility] resident __X__Failure to provide all requested documentation (proof of income for 2023, and/or Patient Intake Application, DOH Attestation, Contract, etc).    Medication assistance will resume once all requested documentation has been returned to our clinic.  If you have questions, please contact our clinic at [phone].    Thank you,  [facility]

## 2021-12-13 ENCOUNTER — Other Ambulatory Visit: Payer: Self-pay
